# Patient Record
Sex: Female | Born: 1975
Health system: Southern US, Community
[De-identification: ages and names within clinical notes are randomized; demographics above are authoritative.]

---

## 2015-01-03 ENCOUNTER — Ambulatory Visit: Payer: Self-pay | Admitting: Pediatrics

## 2015-01-04 ENCOUNTER — Encounter (INDEPENDENT_AMBULATORY_CARE_PROVIDER_SITE_OTHER): Payer: Self-pay

## 2015-01-04 ENCOUNTER — Ambulatory Visit (INDEPENDENT_AMBULATORY_CARE_PROVIDER_SITE_OTHER): Payer: BLUE CROSS/BLUE SHIELD | Admitting: Family

## 2015-01-04 ENCOUNTER — Encounter: Payer: Self-pay | Admitting: Family

## 2015-01-04 VITALS — BP 112/72 | HR 80 | Temp 97.0°F | Ht 65.0 in | Wt 138.0 lb

## 2015-01-04 DIAGNOSIS — Z0000 Encounter for general adult medical examination without abnormal findings: Secondary | ICD-10-CM

## 2015-01-04 NOTE — Patient Instructions (Signed)
Health Maintenance, Female Adopting a healthy lifestyle and getting preventive care can go a long way to promote health and wellness. Talk with your health care provider about what schedule of regular examinations is right for you. This is a good chance for you to check in with your provider about disease prevention and staying healthy. In between checkups, there are plenty of things you can do on your own. Experts have done a lot of research about which lifestyle changes and preventive measures are most likely to keep you healthy. Ask your health care provider for more information. WEIGHT AND DIET  Eat a healthy diet  Be sure to include plenty of vegetables, fruits, low-fat dairy products, and lean protein.  Do not eat a lot of foods high in solid fats, added sugars, or salt.  Get regular exercise. This is one of the most important things you can do for your health.  Most adults should exercise for at least 150 minutes each week. The exercise should increase your heart rate and make you sweat (moderate-intensity exercise).  Most adults should also do strengthening exercises at least twice a week. This is in addition to the moderate-intensity exercise.  Maintain a healthy weight  Body mass index (BMI) is a measurement that can be used to identify possible weight problems. It estimates body fat based on height and weight. Your health care provider can help determine your BMI and help you achieve or maintain a healthy weight.  For females 20 years of age and older:   A BMI below 18.5 is considered underweight.  A BMI of 18.5 to 24.9 is normal.  A BMI of 25 to 29.9 is considered overweight.  A BMI of 30 and above is considered obese.  Watch levels of cholesterol and blood lipids  You should start having your blood tested for lipids and cholesterol at 38 years of age, then have this test every 5 years.  You may need to have your cholesterol levels checked more often if:  Your lipid  or cholesterol levels are high.  You are older than 38 years of age.  You are at high risk for heart disease.  CANCER SCREENING   Lung Cancer  Lung cancer screening is recommended for adults 55-80 years old who are at high risk for lung cancer because of a history of smoking.  A yearly low-dose CT scan of the lungs is recommended for people who:  Currently smoke.  Have quit within the past 15 years.  Have at least a 30-pack-year history of smoking. A pack year is smoking an average of one pack of cigarettes a day for 1 year.  Yearly screening should continue until it has been 15 years since you quit.  Yearly screening should stop if you develop a health problem that would prevent you from having lung cancer treatment.  Breast Cancer  Practice breast self-awareness. This means understanding how your breasts normally appear and feel.  It also means doing regular breast self-exams. Let your health care provider know about any changes, no matter how small.  If you are in your 20s or 30s, you should have a clinical breast exam (CBE) by a health care provider every 1-3 years as part of a regular health exam.  If you are 40 or older, have a CBE every year. Also consider having a breast X-ray (mammogram) every year.  If you have a family history of breast cancer, talk to your health care provider about genetic screening.  If you   are at high risk for breast cancer, talk to your health care provider about having an MRI and a mammogram every year.  Breast cancer gene (BRCA) assessment is recommended for women who have family members with BRCA-related cancers. BRCA-related cancers include:  Breast.  Ovarian.  Tubal.  Peritoneal cancers.  Results of the assessment will determine the need for genetic counseling and BRCA1 and BRCA2 testing. Cervical Cancer Your health care provider may recommend that you be screened regularly for cancer of the pelvic organs (ovaries, uterus, and  vagina). This screening involves a pelvic examination, including checking for microscopic changes to the surface of your cervix (Pap test). You may be encouraged to have this screening done every 3 years, beginning at age 21.  For women ages 30-65, health care providers may recommend pelvic exams and Pap testing every 3 years, or they may recommend the Pap and pelvic exam, combined with testing for human papilloma virus (HPV), every 5 years. Some types of HPV increase your risk of cervical cancer. Testing for HPV may also be done on women of any age with unclear Pap test results.  Other health care providers may not recommend any screening for nonpregnant women who are considered low risk for pelvic cancer and who do not have symptoms. Ask your health care provider if a screening pelvic exam is right for you.  If you have had past treatment for cervical cancer or a condition that could lead to cancer, you need Pap tests and screening for cancer for at least 20 years after your treatment. If Pap tests have been discontinued, your risk factors (such as having a new sexual partner) need to be reassessed to determine if screening should resume. Some women have medical problems that increase the chance of getting cervical cancer. In these cases, your health care provider may recommend more frequent screening and Pap tests. Colorectal Cancer  This type of cancer can be detected and often prevented.  Routine colorectal cancer screening usually begins at 38 years of age and continues through 38 years of age.  Your health care provider may recommend screening at an earlier age if you have risk factors for colon cancer.  Your health care provider may also recommend using home test kits to check for hidden blood in the stool.  A small camera at the end of a tube can be used to examine your colon directly (sigmoidoscopy or colonoscopy). This is done to check for the earliest forms of colorectal  cancer.  Routine screening usually begins at age 50.  Direct examination of the colon should be repeated every 5-10 years through 38 years of age. However, you may need to be screened more often if early forms of precancerous polyps or small growths are found. Skin Cancer  Check your skin from head to toe regularly.  Tell your health care provider about any new moles or changes in moles, especially if there is a change in a mole's shape or color.  Also tell your health care provider if you have a mole that is larger than the size of a pencil eraser.  Always use sunscreen. Apply sunscreen liberally and repeatedly throughout the day.  Protect yourself by wearing long sleeves, pants, a wide-brimmed hat, and sunglasses whenever you are outside. HEART DISEASE, DIABETES, AND HIGH BLOOD PRESSURE   High blood pressure causes heart disease and increases the risk of stroke. High blood pressure is more likely to develop in:  People who have blood pressure in the high end   of the normal range (130-139/85-89 mm Hg).  People who are overweight or obese.  People who are African American.  If you are 38-23 years of age, have your blood pressure checked every 3-5 years. If you are 61 years of age or older, have your blood pressure checked every year. You should have your blood pressure measured twice--once when you are at a hospital or clinic, and once when you are not at a hospital or clinic. Record the average of the two measurements. To check your blood pressure when you are not at a hospital or clinic, you can use:  An automated blood pressure machine at a pharmacy.  A home blood pressure monitor.  If you are between 45 years and 39 years old, ask your health care provider if you should take aspirin to prevent strokes.  Have regular diabetes screenings. This involves taking a blood sample to check your fasting blood sugar level.  If you are at a normal weight and have a low risk for diabetes,  have this test once every three years after 38 years of age.  If you are overweight and have a high risk for diabetes, consider being tested at a younger age or more often. PREVENTING INFECTION  Hepatitis B  If you have a higher risk for hepatitis B, you should be screened for this virus. You are considered at high risk for hepatitis B if:  You were born in a country where hepatitis B is common. Ask your health care provider which countries are considered high risk.  Your parents were born in a high-risk country, and you have not been immunized against hepatitis B (hepatitis B vaccine).  You have HIV or AIDS.  You use needles to inject street drugs.  You live with someone who has hepatitis B.  You have had sex with someone who has hepatitis B.  You get hemodialysis treatment.  You take certain medicines for conditions, including cancer, organ transplantation, and autoimmune conditions. Hepatitis C  Blood testing is recommended for:  Everyone born from [birth year] through 1964.  Anyone with known risk factors for hepatitis C. Sexually transmitted infections (STIs)  You should be screened for sexually transmitted infections (STIs) including gonorrhea and chlamydia if:  You are sexually active and are younger than 38 years of age.  You are older than 38 years of age and your health care provider tells you that you are at risk for this type of infection.  Your sexual activity has changed since you were last screened and you are at an increased risk for chlamydia or gonorrhea. Ask your health care provider if you are at risk.  If you do not have HIV, but are at risk, it may be recommended that you take a prescription medicine daily to prevent HIV infection. This is called pre-exposure prophylaxis (PrEP). You are considered at risk if:  You are sexually active and do not regularly use condoms or know the HIV status of your partner(s).  You take drugs by injection.  You are sexually  active with a partner who has HIV. Talk with your health care provider about whether you are at high risk of being infected with HIV. If you choose to begin PrEP, you should first be tested for HIV. You should then be tested every 3 months for as long as you are taking PrEP.  PREGNANCY   If you are premenopausal and you may become pregnant, ask your health care provider about preconception counseling.  If you may  become pregnant, take 400 to 800 micrograms (mcg) of folic acid every day.  If you want to prevent pregnancy, talk to your health care provider about birth control (contraception). OSTEOPOROSIS AND MENOPAUSE   Osteoporosis is a disease in which the bones lose minerals and strength with aging. This can result in serious bone fractures. Your risk for osteoporosis can be identified using a bone density scan.  If you are 61 years of age or older, or if you are at risk for osteoporosis and fractures, ask your health care provider if you should be screened.  Ask your health care provider whether you should take a calcium or vitamin D supplement to lower your risk for osteoporosis.  Menopause may have certain physical symptoms and risks.  Hormone replacement therapy may reduce some of these symptoms and risks. Talk to your health care provider about whether hormone replacement therapy is right for you.  HOME CARE INSTRUCTIONS   Schedule regular health, dental, and eye exams.  Stay current with your immunizations.   Do not use any tobacco products including cigarettes, chewing tobacco, or electronic cigarettes.  If you are pregnant, do not drink alcohol.  If you are breastfeeding, limit how much and how often you drink alcohol.  Limit alcohol intake to no more than 1 drink per day for nonpregnant women. One drink equals 12 ounces of beer, 5 ounces of wine, or 1 ounces of hard liquor.  Do not use street drugs.  Do not share needles.  Ask your health care provider for help if  you need support or information about quitting drugs.  Tell your health care provider if you often feel depressed.  Tell your health care provider if you have ever been abused or do not feel safe at home.   This information is not intended to replace advice given to you by your health care provider. Make sure you discuss any questions you have with your health care provider.   Document Released: 07/07/2010 Document Revised: 01/12/2014 Document Reviewed: 11/23/2012 Elsevier Interactive Patient Education [employer].

## 2015-01-04 NOTE — Progress Notes (Signed)
   Subjective:    Patient ID: Rebecca Dennis, female    DOB: 05/01/1975, 39 y.o.   MRN: [id number]  HPI PT presents to the office today with CPE with no pap and to establish. PT currently not taking any medications at this time. Pt denies any headache, palpitations, SOB, or edema at this time.     Review of Systems  Constitutional: Negative.   HENT: Negative.   Eyes: Negative.   Respiratory: Negative.  Negative for shortness of breath.   Cardiovascular: Negative.  Negative for palpitations.  Gastrointestinal: Negative.   Endocrine: Negative.   Genitourinary: Negative.   Musculoskeletal: Negative.   Neurological: Negative.  Negative for headaches.  Hematological: Negative.   Psychiatric/Behavioral: Negative.   All other systems reviewed and are negative.      Objective:   Physical Exam  Constitutional: She is oriented to person, place, and time. She appears well-developed and well-nourished. No distress.  HENT:  Head: Normocephalic and atraumatic.  Right Ear: External ear normal.  Left Ear: External ear normal.  Nose: Nose normal.  Mouth/Throat: Oropharynx is clear and moist.  Eyes: Pupils are equal, round, and reactive to light.  Neck: Normal range of motion. Neck supple. No thyromegaly present.  Cardiovascular: Normal rate, regular rhythm, normal heart sounds and intact distal pulses.   No murmur heard. Pulmonary/Chest: Effort normal and breath sounds normal. No respiratory distress. She has no wheezes.  Abdominal: Soft. Bowel sounds are normal. She exhibits no distension. There is no tenderness.  Musculoskeletal: Normal range of motion. She exhibits no edema or tenderness.  Neurological: She is alert and oriented to person, place, and time. She has normal reflexes. No cranial nerve deficit.  Skin: Skin is warm and dry.  Psychiatric: She has a normal mood and affect. Her behavior is normal. Judgment and thought content normal.  Vitals reviewed.     BP 112/72 mmHg   Pulse 80  Temp(Src) 97 F (36.1 C) (Oral)  Ht _0  (1.651 m)  Wt 138 lb (62.596 kg)  BMI 22.96 kg/m2     Assessment & Plan:  1. Annual physical exam - Anemia Profile B - CMP14+EGFR - Lipid panel - Thyroid Panel With TSH - VITAMIN D 25 Hydroxy (Vit-D Deficiency, Fractures)   Continue all meds Labs pending Health Maintenance reviewed Diet and exercise encouraged RTO 1 year  Evelina Dun, FNP

## 2015-01-05 ENCOUNTER — Other Ambulatory Visit: Payer: Self-pay | Admitting: Family

## 2015-01-05 DIAGNOSIS — E559 Vitamin D deficiency, unspecified: Secondary | ICD-10-CM

## 2015-01-05 LAB — CMP14+EGFR
ALBUMIN: 4.1 g/dL (ref 3.5–5.5)
ALT: 7 IU/L (ref 0–32)
AST: 12 IU/L (ref 0–40)
Albumin/Globulin Ratio: 1.5 (ref 1.1–2.5)
Alkaline Phosphatase: 50 IU/L (ref 39–117)
BUN / CREAT RATIO: 15 (ref 8–20)
BUN: 14 mg/dL (ref 6–20)
Bilirubin Total: 0.3 mg/dL (ref 0.0–1.2)
CALCIUM: 9.3 mg/dL (ref 8.7–10.2)
CO2: 22 mmol/L (ref 18–29)
CREATININE: 0.92 mg/dL (ref 0.57–1.00)
Chloride: 101 mmol/L (ref 96–106)
GFR, EST AFRICAN AMERICAN: 91 mL/min/{1.73_m2} (ref >59)
GFR, EST NON AFRICAN AMERICAN: 79 mL/min/{1.73_m2} (ref >59)
GLUCOSE: 98 mg/dL (ref 65–99)
Globulin, Total: 2.7 g/dL (ref 1.5–4.5)
Potassium: 4.6 mmol/L (ref 3.5–5.2)
Sodium: 139 mmol/L (ref 134–144)
TOTAL PROTEIN: 6.8 g/dL (ref 6.0–8.5)

## 2015-01-05 LAB — ANEMIA PROFILE B
BASOS ABS: 0 10*3/uL (ref 0.0–0.2)
Basos: 1 %
EOS (ABSOLUTE): 0 10*3/uL (ref 0.0–0.4)
Eos: 1 %
Ferritin: 43 ng/mL (ref 15–150)
Folate: 7.1 ng/mL (ref >3.0)
Hematocrit: 37 % (ref 34.0–46.6)
Hemoglobin: 12.5 g/dL (ref 11.1–15.9)
IMMATURE GRANULOCYTES: 0 %
IRON: 58 ug/dL (ref 27–159)
Immature Grans (Abs): 0 10*3/uL (ref 0.0–0.1)
Iron Saturation: 19 % (ref 15–55)
Lymphocytes Absolute: 1.2 10*3/uL (ref 0.7–3.1)
Lymphs: 30 %
MCH: 30.9 pg (ref 26.6–33.0)
MCHC: 33.8 g/dL (ref 31.5–35.7)
MCV: 91 fL (ref 79–97)
MONOCYTES: 9 %
Monocytes Absolute: 0.4 10*3/uL (ref 0.1–0.9)
NEUTROS ABS: 2.3 10*3/uL (ref 1.4–7.0)
Neutrophils: 59 %
PLATELETS: 339 10*3/uL (ref 150–379)
RBC: 4.05 x10E6/uL (ref 3.77–5.28)
RDW: 12.9 % (ref 12.3–15.4)
RETIC CT PCT: 0.8 % (ref 0.6–2.6)
TIBC: 311 ug/dL (ref 250–450)
UIBC: 253 ug/dL (ref 131–425)
VITAMIN B 12: 334 pg/mL (ref 211–946)
WBC: 3.9 10*3/uL (ref 3.4–10.8)

## 2015-01-05 LAB — THYROID PANEL WITH TSH
FREE THYROXINE INDEX: 2.4 (ref 1.2–4.9)
T3 Uptake Ratio: 30 % (ref 24–39)
T4, Total: 8 ug/dL (ref 4.5–12.0)
TSH: 2.29 u[IU]/mL (ref 0.450–4.500)

## 2015-01-05 LAB — LIPID PANEL
Chol/HDL Ratio: 1.9 ratio units (ref 0.0–4.4)
Cholesterol, Total: 148 mg/dL (ref 100–199)
HDL: 78 mg/dL (ref >39)
LDL CALC: 64 mg/dL (ref 0–99)
Triglycerides: 32 mg/dL (ref 0–149)
VLDL CHOLESTEROL CAL: 6 mg/dL (ref 5–40)

## 2015-01-05 LAB — VITAMIN D 25 HYDROXY (VIT D DEFICIENCY, FRACTURES): VIT D 25 HYDROXY: 29.7 ng/mL — AB (ref 30.0–100.0)

## 2015-01-05 MED ORDER — VITAMIN D (ERGOCALCIFEROL) 1.25 MG (50000 UNIT) PO CAPS: 50000.0000 [IU] | ORAL_CAPSULE | ORAL | Status: DC

## 2016-03-09 ENCOUNTER — Ambulatory Visit (INDEPENDENT_AMBULATORY_CARE_PROVIDER_SITE_OTHER): Payer: BLUE CROSS/BLUE SHIELD | Admitting: Family

## 2016-03-09 ENCOUNTER — Encounter: Payer: Self-pay | Admitting: Family

## 2016-03-09 VITALS — BP 101/75 | HR 64 | Temp 98.1°F | Ht 65.0 in | Wt 136.8 lb

## 2016-03-09 DIAGNOSIS — Z0000 Encounter for general adult medical examination without abnormal findings: Secondary | ICD-10-CM

## 2016-03-09 DIAGNOSIS — Z01419 Encounter for gynecological examination (general) (routine) without abnormal findings: Secondary | ICD-10-CM | POA: Diagnosis not present

## 2016-03-09 LAB — MICROSCOPIC EXAMINATION
BACTERIA UA: NONE SEEN
RBC, UA: NONE SEEN /hpf (ref 0–?)
RENAL EPITHEL UA: NONE SEEN /HPF

## 2016-03-09 LAB — URINALYSIS, COMPLETE
BILIRUBIN UA: NEGATIVE
GLUCOSE, UA: NEGATIVE
KETONES UA: NEGATIVE
LEUKOCYTES UA: NEGATIVE
Nitrite, UA: NEGATIVE
PROTEIN UA: NEGATIVE
RBC UA: NEGATIVE
SPEC GRAV UA: 1.01 (ref 1.005–1.030)
Urobilinogen, Ur: 0.2 mg/dL (ref 0.2–1.0)
pH, UA: 7 (ref 5.0–7.5)

## 2016-03-09 NOTE — Patient Instructions (Signed)
Health Maintenance, Female Adopting a healthy lifestyle and getting preventive care can go a long way to promote health and wellness. Talk with your health care provider about what schedule of regular examinations is right for you. This is a good chance for you to check in with your provider about disease prevention and staying healthy. In between checkups, there are plenty of things you can do on your own. Experts have done a lot of research about which lifestyle changes and preventive measures are most likely to keep you healthy. Ask your health care provider for more information. Weight and diet Eat a healthy diet  Be sure to include plenty of vegetables, fruits, low-fat dairy products, and lean protein.  Do not eat a lot of foods high in solid fats, added sugars, or salt.  Get regular exercise. This is one of the most important things you can do for your health.  Most adults should exercise for at least 150 minutes each week. The exercise should increase your heart rate and make you sweat (moderate-intensity exercise).  Most adults should also do strengthening exercises at least twice a week. This is in addition to the moderate-intensity exercise. Maintain a healthy weight  Body mass index (BMI) is a measurement that can be used to identify possible weight problems. It estimates body fat based on height and weight. Your health care provider can help determine your BMI and help you achieve or maintain a healthy weight.  For females 76 years of age and older:  A BMI below 18.5 is considered underweight.  A BMI of 18.5 to 24.9 is normal.  A BMI of 25 to 29.9 is considered overweight.  A BMI of 30 and above is considered obese. Watch levels of cholesterol and blood lipids  You should start having your blood tested for lipids and cholesterol at 40 years of age, then have this test every 5 years.  You may need to have your cholesterol levels checked more often if:  Your lipid or  cholesterol levels are high.  You are older than 40 years of age.  You are at high risk for heart disease. Cancer screening Lung Cancer  Lung cancer screening is recommended for adults 64-42 years old who are at high risk for lung cancer because of a history of smoking.  A yearly low-dose CT scan of the lungs is recommended for people who:  Currently smoke.  Have quit within the past 15 years.  Have at least a 30-pack-year history of smoking. A pack year is smoking an average of one pack of cigarettes a day for 1 year.  Yearly screening should continue until it has been 15 years since you quit.  Yearly screening should stop if you develop a health problem that would prevent you from having lung cancer treatment. Breast Cancer  Practice breast self-awareness. This means understanding how your breasts normally appear and feel.  It also means doing regular breast self-exams. Let your health care provider know about any changes, no matter how small.  If you are in your 20s or 30s, you should have a clinical breast exam (CBE) by a health care provider every 1-3 years as part of a regular health exam.  If you are 34 or older, have a CBE every year. Also consider having a breast X-ray (mammogram) every year.  If you have a family history of breast cancer, talk to your health care provider about genetic screening.  If you are at high risk for breast cancer, talk  to your health care provider about having an MRI and a mammogram every year.  Breast cancer gene (BRCA) assessment is recommended for women who have family members with BRCA-related cancers. BRCA-related cancers include:  Breast.  Ovarian.  Tubal.  Peritoneal cancers.  Results of the assessment will determine the need for genetic counseling and BRCA1 and BRCA2 testing. Cervical Cancer  Your health care provider may recommend that you be screened regularly for cancer of the pelvic organs (ovaries, uterus, and vagina).  This screening involves a pelvic examination, including checking for microscopic changes to the surface of your cervix (Pap test). You may be encouraged to have this screening done every 3 years, beginning at age 24.  For women ages 66-65, health care providers may recommend pelvic exams and Pap testing every 3 years, or they may recommend the Pap and pelvic exam, combined with testing for human papilloma virus (HPV), every 5 years. Some types of HPV increase your risk of cervical cancer. Testing for HPV may also be done on women of any age with unclear Pap test results.  Other health care providers may not recommend any screening for nonpregnant women who are considered low risk for pelvic cancer and who do not have symptoms. Ask your health care provider if a screening pelvic exam is right for you.  If you have had past treatment for cervical cancer or a condition that could lead to cancer, you need Pap tests and screening for cancer for at least 20 years after your treatment. If Pap tests have been discontinued, your risk factors (such as having a new sexual partner) need to be reassessed to determine if screening should resume. Some women have medical problems that increase the chance of getting cervical cancer. In these cases, your health care provider may recommend more frequent screening and Pap tests. Colorectal Cancer  This type of cancer can be detected and often prevented.  Routine colorectal cancer screening usually begins at 40 years of age and continues through 40 years of age.  Your health care provider may recommend screening at an earlier age if you have risk factors for colon cancer.  Your health care provider may also recommend using home test kits to check for hidden blood in the stool.  A small camera at the end of a tube can be used to examine your colon directly (sigmoidoscopy or colonoscopy). This is done to check for the earliest forms of colorectal cancer.  Routine  screening usually begins at age 41.  Direct examination of the colon should be repeated every 5-10 years through 40 years of age. However, you may need to be screened more often if early forms of precancerous polyps or small growths are found. Skin Cancer  Check your skin from head to toe regularly.  Tell your health care provider about any new moles or changes in moles, especially if there is a change in a mole's shape or color.  Also tell your health care provider if you have a mole that is larger than the size of a pencil eraser.  Always use sunscreen. Apply sunscreen liberally and repeatedly throughout the day.  Protect yourself by wearing long sleeves, pants, a wide-brimmed hat, and sunglasses whenever you are outside. Heart disease, diabetes, and high blood pressure  High blood pressure causes heart disease and increases the risk of stroke. High blood pressure is more likely to develop in:  People who have blood pressure in the high end of the normal range (130-139/85-89 mm Hg).  People who are overweight or obese.  People who are African American.  If you are 59-24 years of age, have your blood pressure checked every 3-5 years. If you are 34 years of age or older, have your blood pressure checked every year. You should have your blood pressure measured twice-once when you are at a hospital or clinic, and once when you are not at a hospital or clinic. Record the average of the two measurements. To check your blood pressure when you are not at a hospital or clinic, you can use:  An automated blood pressure machine at a pharmacy.  A home blood pressure monitor.  If you are between 29 years and 60 years old, ask your health care provider if you should take aspirin to prevent strokes.  Have regular diabetes screenings. This involves taking a blood sample to check your fasting blood sugar level.  If you are at a normal weight and have a low risk for diabetes, have this test once  every three years after 40 years of age.  If you are overweight and have a high risk for diabetes, consider being tested at a younger age or more often. Preventing infection Hepatitis B  If you have a higher risk for hepatitis B, you should be screened for this virus. You are considered at high risk for hepatitis B if:  You were born in a country where hepatitis B is common. Ask your health care provider which countries are considered high risk.  Your parents were born in a high-risk country, and you have not been immunized against hepatitis B (hepatitis B vaccine).  You have HIV or AIDS.  You use needles to inject street drugs.  You live with someone who has hepatitis B.  You have had sex with someone who has hepatitis B.  You get hemodialysis treatment.  You take certain medicines for conditions, including cancer, organ transplantation, and autoimmune conditions. Hepatitis C  Blood testing is recommended for:  Everyone born from [birth year] through 1964.  Anyone with known risk factors for hepatitis C. Sexually transmitted infections (STIs)  You should be screened for sexually transmitted infections (STIs) including gonorrhea and chlamydia if:  You are sexually active and are younger than 40 years of age.  You are older than 40 years of age and your health care provider tells you that you are at risk for this type of infection.  Your sexual activity has changed since you were last screened and you are at an increased risk for chlamydia or gonorrhea. Ask your health care provider if you are at risk.  If you do not have HIV, but are at risk, it may be recommended that you take a prescription medicine daily to prevent HIV infection. This is called pre-exposure prophylaxis (PrEP). You are considered at risk if:  You are sexually active and do not regularly use condoms or know the HIV status of your partner(s).  You take drugs by injection.  You are sexually active with a partner  who has HIV. Talk with your health care provider about whether you are at high risk of being infected with HIV. If you choose to begin PrEP, you should first be tested for HIV. You should then be tested every 3 months for as long as you are taking PrEP. Pregnancy  If you are premenopausal and you may become pregnant, ask your health care provider about preconception counseling.  If you may become pregnant, take 400 to 800 micrograms (mcg) of folic acid  every day.  If you want to prevent pregnancy, talk to your health care provider about birth control (contraception). Osteoporosis and menopause  Osteoporosis is a disease in which the bones lose minerals and strength with aging. This can result in serious bone fractures. Your risk for osteoporosis can be identified using a bone density scan.  If you are 4 years of age or older, or if you are at risk for osteoporosis and fractures, ask your health care provider if you should be screened.  Ask your health care provider whether you should take a calcium or vitamin D supplement to lower your risk for osteoporosis.  Menopause may have certain physical symptoms and risks.  Hormone replacement therapy may reduce some of these symptoms and risks. Talk to your health care provider about whether hormone replacement therapy is right for you. Follow these instructions at home:  Schedule regular health, dental, and eye exams.  Stay current with your immunizations.  Do not use any tobacco products including cigarettes, chewing tobacco, or electronic cigarettes.  If you are pregnant, do not drink alcohol.  If you are breastfeeding, limit how much and how often you drink alcohol.  Limit alcohol intake to no more than 1 drink per day for nonpregnant women. One drink equals 12 ounces of beer, 5 ounces of wine, or 1 ounces of hard liquor.  Do not use street drugs.  Do not share needles.  Ask your health care provider for help if you need support  or information about quitting drugs.  Tell your health care provider if you often feel depressed.  Tell your health care provider if you have ever been abused or do not feel safe at home. This information is not intended to replace advice given to you by your health care provider. Make sure you discuss any questions you have with your health care provider. Document Released: 07/07/2010 Document Revised: 05/30/2015 Document Reviewed: 09/25/2014 Elsevier Interactive Patient Education  2017 [employer].

## 2016-03-09 NOTE — Progress Notes (Signed)
Subjective:    Patient ID: Rebecca Dennis, female    DOB: [birth year], 41 y.o.   MRN: [id number]  PT presents to the office today with CPE with no pap and to establish. PT currently not taking any medications at this time. Pt denies any headache, palpitations, SOB, or edema at this time.  Gynecologic Exam  The patient's pertinent negatives include no genital itching, genital odor, missed menses or vaginal discharge. Pertinent negatives include no headaches. Her menstrual history has been regular.       Review of Systems  Constitutional: Negative.   HENT: Negative.   Eyes: Negative.   Respiratory: Negative.  Negative for shortness of breath.   Cardiovascular: Negative.  Negative for palpitations.  Gastrointestinal: Negative.   Endocrine: Negative.   Genitourinary: Negative for missed menses and vaginal discharge.  Musculoskeletal: Negative.   Neurological: Negative.  Negative for headaches.  Hematological: Negative.   Psychiatric/Behavioral: Negative.   All other systems reviewed and are negative.  History reviewed. No pertinent family history.  Social History   Social History  . Marital status: Married    Spouse name: N/A  . Number of children: N/A  . Years of education: N/A   Social History Main Topics  . Smoking status: Never Smoker  . Smokeless tobacco: Never Used  . Alcohol use No  . Drug use: No  . Sexual activity: Yes    Birth control/ protection: Coitus interruptus   Other Topics Concern  . None   Social History Narrative  . None       Objective:   Physical Exam  Constitutional: She is oriented to person, place, and time. She appears well-developed and well-nourished. No distress.  HENT:  Head: Normocephalic and atraumatic.  Right Ear: External ear normal.  Left Ear: External ear normal.  Nose: Nose normal.  Mouth/Throat: Oropharynx is clear and moist.  Eyes: Pupils are equal, round, and reactive to light.  Neck: Normal range of motion. Neck supple.  No thyromegaly present.  Cardiovascular: Normal rate, regular rhythm, normal heart sounds and intact distal pulses.   No murmur heard. Pulmonary/Chest: Effort normal and breath sounds normal. No respiratory distress. She has no wheezes. Right breast exhibits no inverted nipple, no mass, no nipple discharge, no skin change and no tenderness. Left breast exhibits no inverted nipple, no mass, no nipple discharge, no skin change and no tenderness. Breasts are symmetrical.  Abdominal: Soft. Bowel sounds are normal. She exhibits no distension. There is no tenderness.  Genitourinary: Vagina normal.  Genitourinary Comments: Bimanual exam- no adnexal masses or tenderness, ovaries nonpalpable   Cervix parous and pink- No discharge   Musculoskeletal: Normal range of motion. She exhibits no edema or tenderness.  Neurological: She is alert and oriented to person, place, and time. She has normal reflexes. No cranial nerve deficit.  Skin: Skin is warm and dry.  Psychiatric: She has a normal mood and affect. Her behavior is normal. Judgment and thought content normal.  Vitals reviewed.     BP 101/75   Pulse 64   Temp 98.1 F (36.7 C) (Oral)   Ht 5' 5"  (1.651 m)   Wt 136 lb 12.8 oz (62.1 kg)   LMP 02/29/2016   BMI 22.76 kg/m      Assessment & Plan:  1. Gynecologic exam normal - Urinalysis, Complete - Pap IG w/ reflex to HPV when ASC-U  2. Annual physical exam - Anemia Profile B - CMP14+EGFR - Lipid panel - Thyroid Panel With TSH - VITAMIN  D 25 Hydroxy (Vit-D Deficiency, Fractures) - Pap IG w/ reflex to HPV when ASC-U   Continue all meds Labs pending Health Maintenance reviewed Diet and exercise encouraged RTO 1 year  Evelina Dun, FNP

## 2016-03-10 LAB — LIPID PANEL
CHOL/HDL RATIO: 1.9 ratio (ref 0.0–4.4)
Cholesterol, Total: 159 mg/dL (ref 100–199)
HDL: 84 mg/dL (ref >39)
LDL CALC: 68 mg/dL (ref 0–99)
Triglycerides: 33 mg/dL (ref 0–149)
VLDL CHOLESTEROL CAL: 7 mg/dL (ref 5–40)

## 2016-03-10 LAB — ANEMIA PROFILE B
BASOS ABS: 0 10*3/uL (ref 0.0–0.2)
Basos: 0 %
EOS (ABSOLUTE): 0 10*3/uL (ref 0.0–0.4)
Eos: 1 %
FERRITIN: 25 ng/mL (ref 15–150)
Folate: 6.8 ng/mL (ref >3.0)
Hematocrit: 37.3 % (ref 34.0–46.6)
Hemoglobin: 12.7 g/dL (ref 11.1–15.9)
IMMATURE GRANS (ABS): 0 10*3/uL (ref 0.0–0.1)
Immature Granulocytes: 0 %
Iron Saturation: 34 % (ref 15–55)
Iron: 107 ug/dL (ref 27–159)
LYMPHS: 26 %
Lymphocytes Absolute: 1.3 10*3/uL (ref 0.7–3.1)
MCH: 31.1 pg (ref 26.6–33.0)
MCHC: 34 g/dL (ref 31.5–35.7)
MCV: 91 fL (ref 79–97)
MONOCYTES: 9 %
MONOS ABS: 0.5 10*3/uL (ref 0.1–0.9)
Neutrophils Absolute: 3.3 10*3/uL (ref 1.4–7.0)
Neutrophils: 64 %
PLATELETS: 293 10*3/uL (ref 150–379)
RBC: 4.08 x10E6/uL (ref 3.77–5.28)
RDW: 12.9 % (ref 12.3–15.4)
RETIC CT PCT: 0.9 % (ref 0.6–2.6)
Total Iron Binding Capacity: 319 ug/dL (ref 250–450)
UIBC: 212 ug/dL (ref 131–425)
VITAMIN B 12: 236 pg/mL (ref 232–1245)
WBC: 5.1 10*3/uL (ref 3.4–10.8)

## 2016-03-10 LAB — CMP14+EGFR
A/G RATIO: 1.6 (ref 1.2–2.2)
ALT: 7 IU/L (ref 0–32)
AST: 14 IU/L (ref 0–40)
Albumin: 4.2 g/dL (ref 3.5–5.5)
Alkaline Phosphatase: 41 IU/L (ref 39–117)
BILIRUBIN TOTAL: 0.4 mg/dL (ref 0.0–1.2)
BUN/Creatinine Ratio: 15 (ref 9–23)
BUN: 13 mg/dL (ref 6–20)
CHLORIDE: 103 mmol/L (ref 96–106)
CO2: 25 mmol/L (ref 18–29)
Calcium: 9.1 mg/dL (ref 8.7–10.2)
Creatinine, Ser: 0.89 mg/dL (ref 0.57–1.00)
GFR calc non Af Amer: 82 mL/min/{1.73_m2} (ref >59)
GFR, EST AFRICAN AMERICAN: 94 mL/min/{1.73_m2} (ref >59)
GLUCOSE: 88 mg/dL (ref 65–99)
Globulin, Total: 2.6 g/dL (ref 1.5–4.5)
POTASSIUM: 4.5 mmol/L (ref 3.5–5.2)
Sodium: 139 mmol/L (ref 134–144)
TOTAL PROTEIN: 6.8 g/dL (ref 6.0–8.5)

## 2016-03-10 LAB — THYROID PANEL WITH TSH
FREE THYROXINE INDEX: 1.7 (ref 1.2–4.9)
T3 UPTAKE RATIO: 26 % (ref 24–39)
T4 TOTAL: 6.7 ug/dL (ref 4.5–12.0)
TSH: 1.89 u[IU]/mL (ref 0.450–4.500)

## 2016-03-10 LAB — VITAMIN D 25 HYDROXY (VIT D DEFICIENCY, FRACTURES): Vit D, 25-Hydroxy: 33.9 ng/mL (ref 30.0–100.0)

## 2016-03-11 LAB — PAP IG W/ RFLX HPV ASCU: PAP SMEAR COMMENT: 0

## 2017-03-15 ENCOUNTER — Encounter: Payer: Self-pay | Admitting: Family

## 2017-03-15 ENCOUNTER — Ambulatory Visit (INDEPENDENT_AMBULATORY_CARE_PROVIDER_SITE_OTHER): Payer: BLUE CROSS/BLUE SHIELD | Admitting: Family

## 2017-03-15 ENCOUNTER — Telehealth: Payer: Self-pay | Admitting: Family

## 2017-03-15 VITALS — BP 115/77 | HR 80 | Temp 97.9°F | Ht 65.0 in | Wt 135.6 lb

## 2017-03-15 DIAGNOSIS — Z0000 Encounter for general adult medical examination without abnormal findings: Secondary | ICD-10-CM

## 2017-03-15 DIAGNOSIS — E559 Vitamin D deficiency, unspecified: Secondary | ICD-10-CM

## 2017-03-15 NOTE — Progress Notes (Signed)
   Subjective:    Patient ID: Rebecca Dennis, female    DOB: Apr 21, 1975, 42 y.o.   MRN: [id number]  HPI PT presents to the office today for CPE without pap. Currently not taking any medications at this time. Pt denies any headache, palpitations, SOB, or edema at this time.    Review of Systems  All other systems reviewed and are negative.      Objective:   Physical Exam  Constitutional: She is oriented to person, place, and time. She appears well-developed and well-nourished. No distress.  HENT:  Head: Normocephalic and atraumatic.  Right Ear: External ear normal.  Left Ear: External ear normal.  Nose: Nose normal.  Mouth/Throat: Oropharynx is clear and moist.  Eyes: Pupils are equal, round, and reactive to light.  Neck: Normal range of motion. Neck supple. No thyromegaly present.  Cardiovascular: Normal rate, regular rhythm, normal heart sounds and intact distal pulses.  No murmur heard. Pulmonary/Chest: Effort normal and breath sounds normal. No respiratory distress. She has no wheezes.  Abdominal: Soft. Bowel sounds are normal. She exhibits no distension. There is no tenderness.  Musculoskeletal: Normal range of motion. She exhibits no edema or tenderness.  Neurological: She is alert and oriented to person, place, and time.  Skin: Skin is warm and dry.  Psychiatric: She has a normal mood and affect. Her behavior is normal. Judgment and thought content normal.  Vitals reviewed.     BP 115/77   Pulse 80   Temp 97.9 F (36.6 C) (Oral)   Ht '5\' 5"'$  (1.651 m)   Wt 135 lb 9.6 oz (61.5 kg)   BMI 22.57 kg/m      Assessment & Plan:  1. Annual physical exam  - Anemia Profile B - CMP14+EGFR - Lipid panel - VITAMIN D 25 Hydroxy (Vit-D Deficiency, Fractures) - TSH  2. Vitamin D deficiency   Continue all meds Labs pending Health Maintenance reviewed Diet and exercise encouraged RTO 1 year  Evelina Dun, FNP

## 2017-03-15 NOTE — Patient Instructions (Signed)

## 2017-03-16 LAB — CMP14+EGFR
A/G RATIO: 1.7 (ref 1.2–2.2)
ALBUMIN: 4.5 g/dL (ref 3.5–5.5)
ALT: 6 IU/L (ref 0–32)
AST: 17 IU/L (ref 0–40)
Alkaline Phosphatase: 56 IU/L (ref 39–117)
BUN / CREAT RATIO: 14 (ref 9–23)
BUN: 13 mg/dL (ref 6–24)
Bilirubin Total: 0.4 mg/dL (ref 0.0–1.2)
CALCIUM: 9.1 mg/dL (ref 8.7–10.2)
CO2: 21 mmol/L (ref 20–29)
Chloride: 103 mmol/L (ref 96–106)
Creatinine, Ser: 0.9 mg/dL (ref 0.57–1.00)
GFR calc non Af Amer: 80 mL/min/{1.73_m2} (ref >59)
GFR, EST AFRICAN AMERICAN: 92 mL/min/{1.73_m2} (ref >59)
Globulin, Total: 2.7 g/dL (ref 1.5–4.5)
Glucose: 91 mg/dL (ref 65–99)
POTASSIUM: 4.5 mmol/L (ref 3.5–5.2)
Sodium: 140 mmol/L (ref 134–144)
TOTAL PROTEIN: 7.2 g/dL (ref 6.0–8.5)

## 2017-03-16 LAB — ANEMIA PROFILE B
BASOS: 0 %
Basophils Absolute: 0 10*3/uL (ref 0.0–0.2)
EOS (ABSOLUTE): 0 10*3/uL (ref 0.0–0.4)
EOS: 0 %
FERRITIN: 49 ng/mL (ref 15–150)
Folate: 16.3 ng/mL (ref >3.0)
HEMATOCRIT: 36.4 % (ref 34.0–46.6)
HEMOGLOBIN: 12.4 g/dL (ref 11.1–15.9)
IMMATURE GRANS (ABS): 0 10*3/uL (ref 0.0–0.1)
IRON: 107 ug/dL (ref 27–159)
Immature Granulocytes: 0 %
Iron Saturation: 35 % (ref 15–55)
LYMPHS: 23 %
Lymphocytes Absolute: 1.4 10*3/uL (ref 0.7–3.1)
MCH: 31.1 pg (ref 26.6–33.0)
MCHC: 34.1 g/dL (ref 31.5–35.7)
MCV: 91 fL (ref 79–97)
Monocytes Absolute: 0.5 10*3/uL (ref 0.1–0.9)
Monocytes: 7 %
NEUTROS ABS: 4.4 10*3/uL (ref 1.4–7.0)
Neutrophils: 70 %
Platelets: 302 10*3/uL (ref 150–379)
RBC: 3.99 x10E6/uL (ref 3.77–5.28)
RDW: 12.9 % (ref 12.3–15.4)
Retic Ct Pct: 0.6 % (ref 0.6–2.6)
Total Iron Binding Capacity: 306 ug/dL (ref 250–450)
UIBC: 199 ug/dL (ref 131–425)
Vitamin B-12: 318 pg/mL (ref 232–1245)
WBC: 6.3 10*3/uL (ref 3.4–10.8)

## 2017-03-16 LAB — LIPID PANEL
CHOL/HDL RATIO: 1.9 ratio (ref 0.0–4.4)
Cholesterol, Total: 136 mg/dL (ref 100–199)
HDL: 71 mg/dL (ref >39)
LDL Calculated: 58 mg/dL (ref 0–99)
Triglycerides: 36 mg/dL (ref 0–149)
VLDL Cholesterol Cal: 7 mg/dL (ref 5–40)

## 2017-03-16 LAB — VITAMIN D 25 HYDROXY (VIT D DEFICIENCY, FRACTURES): VIT D 25 HYDROXY: 39 ng/mL (ref 30.0–100.0)

## 2017-03-16 LAB — TSH: TSH: 1.25 u[IU]/mL (ref 0.450–4.500)

## 2017-03-19 ENCOUNTER — Telehealth: Payer: Self-pay

## 2017-03-19 DIAGNOSIS — Z1239 Encounter for other screening for malignant neoplasm of breast: Secondary | ICD-10-CM

## 2017-03-19 NOTE — Telephone Encounter (Signed)
[name]

## 2017-03-22 NOTE — Addendum Note (Signed)
Addended by: Jannifer RodneyHAWKS, Kaytlyn Din A on: 03/22/2017 08:31 AM   Modules accepted: Orders

## 2017-03-24 NOTE — Telephone Encounter (Signed)
[facility]  Letter sent with appointment date/time

## 2017-04-02 ENCOUNTER — Ambulatory Visit (HOSPITAL_COMMUNITY): Payer: Self-pay

## 2017-04-05 ENCOUNTER — Encounter (HOSPITAL_COMMUNITY): Payer: Self-pay

## 2017-04-05 ENCOUNTER — Other Ambulatory Visit: Payer: Self-pay | Admitting: Family

## 2017-04-05 ENCOUNTER — Ambulatory Visit (HOSPITAL_COMMUNITY)
Admission: RE | Admit: 2017-04-05 | Discharge: 2017-04-05 | Disposition: A | Discharge status: Home / Self Care | Payer: BLUE CROSS/BLUE SHIELD | Source: Ambulatory Visit | Attending: Family | Admitting: Family

## 2017-04-05 DIAGNOSIS — Z1231 Encounter for screening mammogram for malignant neoplasm of breast: Secondary | ICD-10-CM | POA: Insufficient documentation

## 2017-04-05 DIAGNOSIS — Z1239 Encounter for other screening for malignant neoplasm of breast: Secondary | ICD-10-CM

## 2017-05-10 ENCOUNTER — Telehealth: Payer: Self-pay | Admitting: Family

## 2017-05-11 NOTE — Telephone Encounter (Signed)
Spoke with pt regarding tick bite Pt will monitor bite for redness, drainage or rash Will call for appt if sxs

## 2017-12-09 ENCOUNTER — Encounter: Payer: Self-pay | Admitting: Family Medicine

## 2017-12-09 ENCOUNTER — Ambulatory Visit (INDEPENDENT_AMBULATORY_CARE_PROVIDER_SITE_OTHER): Payer: Managed Care, Other (non HMO)

## 2017-12-09 ENCOUNTER — Ambulatory Visit: Payer: Managed Care, Other (non HMO) | Admitting: Family Medicine

## 2017-12-09 ENCOUNTER — Other Ambulatory Visit: Payer: Self-pay | Admitting: Family Medicine

## 2017-12-09 VITALS — BP 117/82 | HR 66 | Temp 96.9°F | Ht 65.0 in | Wt 137.0 lb

## 2017-12-09 DIAGNOSIS — M25572 Pain in left ankle and joints of left foot: Secondary | ICD-10-CM | POA: Diagnosis not present

## 2017-12-09 DIAGNOSIS — S93492A Sprain of other ligament of left ankle, initial encounter: Secondary | ICD-10-CM

## 2017-12-09 DIAGNOSIS — R52 Pain, unspecified: Secondary | ICD-10-CM

## 2017-12-09 NOTE — Progress Notes (Signed)
BP 117/82   Pulse 66   Temp (!) 96.9 F (36.1 C) (Oral)   Ht 5\' 5"  (1.651 m)   Wt 137 lb (62.1 kg)   BMI 22.80 kg/m    Subjective:    Patient ID: Rebecca Dennis, female    DOB: 04-16-1975, 42 y.o.   MRN: [id number]  HPI: Rebecca Dennis is a 42 y.o. female presenting on 12/09/2017 for Ankle Pain (Stepped in a hole)   HPI Patient comes in because she stepped in a hole earlier today and is having some pain in her ankle and wanted to make sure that nothing was fractured.  She says that she was in her yard and walking and she knew the hole was there but she still actually stepped in it and rolled her ankle to the outside on that left ankle.  She has pain on the outside of her ankle and has a little bit of swelling there as well.  She is having some instability in that ankle but denies any giving way and says the pain is mild to moderate at this point but she just one make sure it was not fractured  Relevant past medical, surgical, family and social history reviewed and updated as indicated. Interim medical history since our last visit reviewed. Allergies and medications reviewed and updated.  Review of Systems  Constitutional: Negative for chills and fever.  Eyes: Negative for visual disturbance.  Respiratory: Negative for chest tightness and shortness of breath.   Cardiovascular: Negative for chest pain and leg swelling.  Musculoskeletal: Positive for arthralgias and joint swelling. Negative for back pain and gait problem.  Skin: Negative for rash.  Neurological: Negative for light-headedness and headaches.  Psychiatric/Behavioral: Negative for agitation and behavioral problems.  All other systems reviewed and are negative.   Per HPI unless specifically indicated above   Allergies as of 12/09/2017   No Known Allergies     Medication List    as of 12/09/2017  4:58 PM   You have not been prescribed any medications.        Objective:    BP 117/82   Pulse 66   Temp (!) 96.9 F  (36.1 C) (Oral)   Ht 5\' 5"  (1.651 m)   Wt 137 lb (62.1 kg)   BMI 22.80 kg/m   Wt Readings from Last 3 Encounters:  12/09/17 137 lb (62.1 kg)  03/15/17 135 lb 9.6 oz (61.5 kg)  03/09/16 136 lb 12.8 oz (62.1 kg)    Physical Exam  Constitutional: She is oriented to person, place, and time. She appears well-developed and well-nourished. No distress.  Eyes: Conjunctivae are normal.  Musculoskeletal: Normal range of motion. She exhibits no edema.       Left ankle: She exhibits swelling. She exhibits normal range of motion, no ecchymosis and no deformity. Tenderness. AITFL tenderness found.       Feet:  Neurological: She is alert and oriented to person, place, and time.  Skin: Skin is warm and dry. No rash noted. She is not diaphoretic. No erythema.  Nursing note and vitals reviewed.   Left ankle x-ray: No signs of acute bony abnormality fracture or dislocation    Assessment & Plan:   Problem List Items Addressed This Visit    None    Visit Diagnoses    Sprain of anterior talofibular ligament of left ankle, initial encounter    -  Primary    Gave list of exercises and stretches and recommended icing and elevation  and rehabbing.  Follow up plan: Return if symptoms worsen or fail to improve.  Counseling provided for all of the vaccine components No orders of the defined types were placed in this encounter.   Arville Care, MD [facility] 12/09/2017, 4:58 PM

## 2018-03-21 ENCOUNTER — Encounter: Payer: Self-pay | Admitting: Family

## 2018-03-21 ENCOUNTER — Other Ambulatory Visit: Payer: Self-pay

## 2018-03-21 ENCOUNTER — Ambulatory Visit (INDEPENDENT_AMBULATORY_CARE_PROVIDER_SITE_OTHER): Payer: Managed Care, Other (non HMO) | Admitting: Family

## 2018-03-21 VITALS — BP 99/65 | HR 68 | Temp 97.8°F | Ht 65.0 in | Wt 137.2 lb

## 2018-03-21 DIAGNOSIS — Z0000 Encounter for general adult medical examination without abnormal findings: Secondary | ICD-10-CM | POA: Diagnosis not present

## 2018-03-21 DIAGNOSIS — Z01419 Encounter for gynecological examination (general) (routine) without abnormal findings: Secondary | ICD-10-CM

## 2018-03-21 LAB — URINALYSIS, COMPLETE
BILIRUBIN UA: NEGATIVE
GLUCOSE, UA: NEGATIVE
Ketones, UA: NEGATIVE
LEUKOCYTES UA: NEGATIVE
Nitrite, UA: NEGATIVE
PH UA: 6.5 (ref 5.0–7.5)
PROTEIN UA: NEGATIVE
RBC UA: NEGATIVE
Specific Gravity, UA: 1.015 (ref 1.005–1.030)
UUROB: 0.2 mg/dL (ref 0.2–1.0)

## 2018-03-21 LAB — MICROSCOPIC EXAMINATION
BACTERIA UA: NONE SEEN
RBC, UA: NONE SEEN /hpf (ref 0–2)
RENAL EPITHEL UA: NONE SEEN /HPF
WBC UA: NONE SEEN /HPF (ref 0–5)

## 2018-03-21 NOTE — Progress Notes (Signed)
Subjective:    Patient ID: Rebecca Dennis, female    DOB: May 27, 1975, 43 y.o.   MRN: [id number]  Chief Complaint  Patient presents with  . Gynecologic Exam    PT presents to the office today for CPE with pap. Currently not taking any medications at this time. Pt denies any headache, palpitations, SOB, or edema at this time.  Gynecologic Exam  The patient's pertinent negatives include no genital itching, genital lesions or vaginal discharge. The patient is experiencing no pain. Pertinent negatives include no painful intercourse.      Review of Systems  Genitourinary: Negative for vaginal discharge.  All other systems reviewed and are negative.      Objective:   Physical Exam Vitals signs reviewed.  Constitutional:      General: She is not in acute distress.    Appearance: She is well-developed.  HENT:     Head: Normocephalic and atraumatic.     Right Ear: Tympanic membrane normal.     Left Ear: Tympanic membrane normal.  Eyes:     Pupils: Pupils are equal, round, and reactive to light.  Neck:     Musculoskeletal: Normal range of motion and neck supple.     Thyroid: No thyromegaly.  Cardiovascular:     Rate and Rhythm: Normal rate and regular rhythm.     Heart sounds: Normal heart sounds. No murmur.  Pulmonary:     Effort: Pulmonary effort is normal. No respiratory distress.     Breath sounds: Normal breath sounds. No wheezing.  Chest:     Breasts:        Right: No swelling, bleeding, inverted nipple, mass, nipple discharge, skin change or tenderness.        Left: No swelling, bleeding, inverted nipple, mass, nipple discharge, skin change or tenderness.  Abdominal:     General: Bowel sounds are normal. There is no distension.     Palpations: Abdomen is soft.     Tenderness: There is no abdominal tenderness.  Genitourinary:    General: Normal vulva.     Comments: Bimanual exam- no adnexal masses or tenderness, ovaries nonpalpable   Cervix parous and pink- No  discharge  Musculoskeletal: Normal range of motion.        General: No tenderness.  Skin:    General: Skin is warm and dry.  Neurological:     Mental Status: She is alert and oriented to person, place, and time.     Cranial Nerves: No cranial nerve deficit.     Deep Tendon Reflexes: Reflexes are normal and symmetric.  Psychiatric:        Behavior: Behavior normal.        Thought Content: Thought content normal.        Judgment: Judgment normal.       BP 99/65   Pulse 68   Temp 97.8 F (36.6 C) (Oral)   Ht _0  (1.651 m)   Wt 137 lb 3.2 oz (62.2 kg)   BMI 22.83 kg/m      Assessment & Plan:  Rebecca Dennis comes in today with chief complaint of Gynecologic Exam   Diagnosis and orders addressed:  1. Normal gynecologic examination - Urinalysis, Complete - IGP, Aptima HPV, rfx 16/18,45  2. Annual physical exam - CMP14+EGFR - CBC with Differential/Platelet - Lipid panel - TSH - IGP, Aptima HPV, rfx 16/18,45   Labs pending Health Maintenance reviewed Diet and exercise encouraged  Follow up plan: 1 year   Evelina Dun, FNP

## 2018-03-21 NOTE — Patient Instructions (Signed)

## 2018-03-22 LAB — CBC WITH DIFFERENTIAL/PLATELET
BASOS ABS: 0.1 10*3/uL (ref 0.0–0.2)
Basos: 1 %
EOS (ABSOLUTE): 0 10*3/uL (ref 0.0–0.4)
Eos: 0 %
HEMOGLOBIN: 12.9 g/dL (ref 11.1–15.9)
Hematocrit: 38.4 % (ref 34.0–46.6)
IMMATURE GRANULOCYTES: 0 %
Immature Grans (Abs): 0 10*3/uL (ref 0.0–0.1)
LYMPHS: 20 %
Lymphocytes Absolute: 1.6 10*3/uL (ref 0.7–3.1)
MCH: 30.2 pg (ref 26.6–33.0)
MCHC: 33.6 g/dL (ref 31.5–35.7)
MCV: 90 fL (ref 79–97)
Monocytes Absolute: 0.6 10*3/uL (ref 0.1–0.9)
Monocytes: 8 %
NEUTROS ABS: 5.7 10*3/uL (ref 1.4–7.0)
Neutrophils: 71 %
Platelets: 309 10*3/uL (ref 150–450)
RBC: 4.27 x10E6/uL (ref 3.77–5.28)
RDW: 11.7 % (ref 11.7–15.4)
WBC: 8 10*3/uL (ref 3.4–10.8)

## 2018-03-22 LAB — CMP14+EGFR
ALT: 8 IU/L (ref 0–32)
AST: 22 IU/L (ref 0–40)
Albumin/Globulin Ratio: 2 (ref 1.2–2.2)
Albumin: 4.7 g/dL (ref 3.8–4.8)
Alkaline Phosphatase: 45 IU/L (ref 39–117)
BILIRUBIN TOTAL: 0.3 mg/dL (ref 0.0–1.2)
BUN/Creatinine Ratio: 15 (ref 9–23)
BUN: 12 mg/dL (ref 6–24)
CALCIUM: 9.3 mg/dL (ref 8.7–10.2)
CHLORIDE: 103 mmol/L (ref 96–106)
CO2: 21 mmol/L (ref 20–29)
Creatinine, Ser: 0.82 mg/dL (ref 0.57–1.00)
GFR, EST AFRICAN AMERICAN: 103 mL/min/{1.73_m2} (ref >59)
GFR, EST NON AFRICAN AMERICAN: 89 mL/min/{1.73_m2} (ref >59)
GLUCOSE: 89 mg/dL (ref 65–99)
Globulin, Total: 2.4 g/dL (ref 1.5–4.5)
Potassium: 4.3 mmol/L (ref 3.5–5.2)
Sodium: 139 mmol/L (ref 134–144)
TOTAL PROTEIN: 7.1 g/dL (ref 6.0–8.5)

## 2018-03-22 LAB — LIPID PANEL
CHOLESTEROL TOTAL: 158 mg/dL (ref 100–199)
Chol/HDL Ratio: 1.9 ratio (ref 0.0–4.4)
HDL: 85 mg/dL (ref >39)
LDL CALC: 66 mg/dL (ref 0–99)
TRIGLYCERIDES: 36 mg/dL (ref 0–149)
VLDL CHOLESTEROL CAL: 7 mg/dL (ref 5–40)

## 2018-03-22 LAB — TSH: TSH: 3.15 u[IU]/mL (ref 0.450–4.500)

## 2018-03-24 LAB — IGP, APTIMA HPV, RFX 16/18,45: HPV APTIMA: NEGATIVE

## 2018-05-31 ENCOUNTER — Other Ambulatory Visit (HOSPITAL_COMMUNITY): Payer: Self-pay | Admitting: Family

## 2018-05-31 DIAGNOSIS — Z1231 Encounter for screening mammogram for malignant neoplasm of breast: Secondary | ICD-10-CM

## 2018-06-06 ENCOUNTER — Ambulatory Visit (HOSPITAL_COMMUNITY)
Admission: RE | Admit: 2018-06-06 | Discharge: 2018-06-06 | Disposition: A | Discharge status: Home / Self Care | Payer: Managed Care, Other (non HMO) | Source: Ambulatory Visit | Attending: Family | Admitting: Family

## 2018-06-06 ENCOUNTER — Other Ambulatory Visit: Payer: Self-pay

## 2018-06-06 DIAGNOSIS — Z1231 Encounter for screening mammogram for malignant neoplasm of breast: Secondary | ICD-10-CM | POA: Diagnosis not present

## 2018-08-01 ENCOUNTER — Encounter: Payer: Self-pay | Admitting: Nurse Practitioner

## 2018-08-01 ENCOUNTER — Ambulatory Visit: Payer: Managed Care, Other (non HMO) | Admitting: Nurse Practitioner

## 2018-08-01 ENCOUNTER — Other Ambulatory Visit: Payer: Self-pay

## 2018-08-01 VITALS — BP 93/62 | HR 67 | Temp 98.6°F | Ht 65.0 in | Wt 142.0 lb

## 2018-08-01 DIAGNOSIS — S0006XA Insect bite (nonvenomous) of scalp, initial encounter: Secondary | ICD-10-CM | POA: Diagnosis not present

## 2018-08-01 DIAGNOSIS — W57XXXA Bitten or stung by nonvenomous insect and other nonvenomous arthropods, initial encounter: Secondary | ICD-10-CM

## 2018-08-01 MED ORDER — DOXYCYCLINE HYCLATE 100 MG PO TABS: ORAL_TABLET | ORAL | 0 refills | Status: DC

## 2018-08-01 NOTE — Progress Notes (Signed)
   Subjective:    Patient ID: Rebecca Dennis, female    DOB: 01-02-1976, 43 y.o.   MRN: [id number]   Chief Complaint: Tick Removal   HPI Patient said she removed a tick from her scalp 1 week ago. Area was sore to touch. She said the tick was on there for less than 24 hours. She denies any headaches, fatigue or fever.   Review of Systems  Constitutional: Negative for activity change and appetite change.  HENT: Negative.   Eyes: Negative for pain.  Respiratory: Negative for shortness of breath.   Cardiovascular: Negative for chest pain, palpitations and leg swelling.  Gastrointestinal: Negative for abdominal pain.  Endocrine: Negative for polydipsia.  Genitourinary: Negative.   Skin: Negative for rash.  Neurological: Negative for dizziness, weakness and headaches.  Hematological: Does not bruise/bleed easily.  Psychiatric/Behavioral: Negative.   All other systems reviewed and are negative.      Objective:   Physical Exam Vitals signs and nursing note reviewed.  Constitutional:      Appearance: Normal appearance.  Cardiovascular:     Rate and Rhythm: Normal rate and regular rhythm.     Pulses: Normal pulses.     Heart sounds: Normal heart sounds.  Skin:    General: Skin is warm and dry.     Comments: Small pinpoint nontender erythematous esio left post scalp area.  Neurological:     General: No focal deficit present.     Mental Status: She is alert and oriented to person, place, and time.  Psychiatric:        Mood and Affect: Mood normal.        Behavior: Behavior normal.    BP 93/62   Pulse 67   Temp 98.6 F (37 C) (Oral)   Ht 5\' 5"  (1.651 m)   Wt 142 lb (64.4 kg)   BMI 23.63 kg/m         Assessment & Plan:  Rebecca Dennis in today with chief complaint of Tick Removal   1. Tick bite, initial encounter Given prophylactic doxycycline for any new tick bite.  Check daly for any new ticks. - doxycycline (VIBRA-TABS) 100 MG tablet; 1 po when has tick bite   Dispense: 10 tablet; Refill: 0  Mary-Margaret Hassell Done, FNP

## 2018-08-01 NOTE — Patient Instructions (Signed)

## 2018-09-07 ENCOUNTER — Other Ambulatory Visit: Payer: Self-pay | Admitting: *Deleted

## 2018-09-07 DIAGNOSIS — Z20822 Contact with and (suspected) exposure to covid-19: Secondary | ICD-10-CM

## 2018-09-08 LAB — NOVEL CORONAVIRUS, NAA: SARS-CoV-2, NAA: NOT DETECTED

## 2018-10-07 ENCOUNTER — Other Ambulatory Visit: Payer: Self-pay

## 2018-10-07 ENCOUNTER — Ambulatory Visit: Payer: Managed Care, Other (non HMO) | Admitting: Nurse Practitioner

## 2018-10-07 ENCOUNTER — Encounter: Payer: Self-pay | Admitting: Nurse Practitioner

## 2018-10-07 DIAGNOSIS — N3000 Acute cystitis without hematuria: Secondary | ICD-10-CM | POA: Diagnosis not present

## 2018-10-07 DIAGNOSIS — R300 Dysuria: Secondary | ICD-10-CM

## 2018-10-07 LAB — URINALYSIS, COMPLETE
Bilirubin, UA: NEGATIVE
Glucose, UA: NEGATIVE
Ketones, UA: NEGATIVE
Nitrite, UA: NEGATIVE
Protein,UA: NEGATIVE
Specific Gravity, UA: 1.015 (ref 1.005–1.030)
Urobilinogen, Ur: 0.2 mg/dL (ref 0.2–1.0)
pH, UA: 7 (ref 5.0–7.5)

## 2018-10-07 LAB — MICROSCOPIC EXAMINATION: Renal Epithel, UA: NONE SEEN /hpf

## 2018-10-07 MED ORDER — CEPHALEXIN 500 MG PO CAPS: 500.0000 mg | ORAL_CAPSULE | Freq: Two times a day (BID) | ORAL | 0 refills | Status: DC

## 2018-10-07 NOTE — Progress Notes (Signed)
   Virtual Visit via telephone Note Due to COVID-19 pandemic this visit was conducted virtually. This visit type was conducted due to national recommendations for restrictions regarding the COVID-19 Pandemic (e.g. social distancing, sheltering in place) in an effort to limit this patient's exposure and mitigate transmission in our community. All issues noted in this document were discussed and addressed.  A physical exam was not performed with this format.  I connected with Rebecca Dennis on 10/07/18 at 10:00 by telephone and verified that I am speaking with the correct person using two identifiers. Rebecca Dennis is currently located at work and no one is currently with her during visit. The provider, Mary-Margaret Hassell Done, FNP is located in their office at time of visit.  I discussed the limitations, risks, security and privacy concerns of performing an evaluation and management service by telephone and the availability of in person appointments. I also discussed with the patient that there may be a patient responsible charge related to this service. The patient expressed understanding and agreed to proceed.   History and Present Illness:  Patient calls in c/o dysuria, frequency and urgency. Has used no OTC meds. Denies back pain and suprpubic pain.   Review of Systems  Genitourinary: Positive for dysuria, frequency and urgency.  All other systems reviewed and are negative.    Observations/Objective: Alert and oriented- answers all questions appropriately No distress    Assessment and Plan: [name] in today with chief complaint of Urinary Tract Infection   1. Dysuria  - Urinalysis, Complete  2. Acute cystitis without hematuria Take medication as prescribe Cotton underwear Take shower not bath Cranberry juice, yogurt Force fluids AZO over the counter X2 days Culture pending RTO prn  - cephALEXin (KEFLEX) 500 MG capsule; Take 1 capsule (500 mg total) by mouth 2 (two) times  daily.  Dispense: 14 capsule; Refill: 0 - Urine Culture   Follow Up Instructions: prn    I discussed the assessment and treatment plan with the patient. The patient was provided an opportunity to ask questions and all were answered. The patient agreed with the plan and demonstrated an understanding of the instructions.   The patient was advised to call back or seek an in-person evaluation if the symptoms worsen or if the condition fails to improve as anticipated.  The above assessment and management plan was discussed with the patient. The patient verbalized understanding of and has agreed to the management plan. Patient is aware to call the clinic if symptoms persist or worsen. Patient is aware when to return to the clinic for a follow-up visit. Patient educated on when it is appropriate to go to the emergency department.   Time call ended: 10:10  I provided 10 minutes of non-face-to-face time during this encounter.    Mary-Margaret Hassell Done, FNP

## 2018-10-08 LAB — URINE CULTURE

## 2018-11-30 ENCOUNTER — Ambulatory Visit: Payer: Managed Care, Other (non HMO)

## 2018-12-15 ENCOUNTER — Other Ambulatory Visit: Payer: Self-pay

## 2018-12-15 DIAGNOSIS — Z20822 Contact with and (suspected) exposure to covid-19: Secondary | ICD-10-CM

## 2018-12-16 LAB — NOVEL CORONAVIRUS, NAA: SARS-CoV-2, NAA: NOT DETECTED

## 2018-12-19 ENCOUNTER — Other Ambulatory Visit: Payer: Self-pay

## 2018-12-19 DIAGNOSIS — Z20822 Contact with and (suspected) exposure to covid-19: Secondary | ICD-10-CM

## 2018-12-22 LAB — NOVEL CORONAVIRUS, NAA: SARS-CoV-2, NAA: NOT DETECTED

## 2018-12-23 ENCOUNTER — Other Ambulatory Visit: Payer: Managed Care, Other (non HMO)

## 2019-10-30 ENCOUNTER — Other Ambulatory Visit: Payer: Self-pay

## 2019-10-30 ENCOUNTER — Encounter: Payer: Self-pay | Admitting: Family

## 2019-10-30 ENCOUNTER — Ambulatory Visit (INDEPENDENT_AMBULATORY_CARE_PROVIDER_SITE_OTHER): Payer: 59 | Admitting: Family

## 2019-10-30 VITALS — BP 110/69 | HR 67 | Temp 97.9°F | Ht 65.0 in | Wt 140.8 lb

## 2019-10-30 DIAGNOSIS — R635 Abnormal weight gain: Secondary | ICD-10-CM | POA: Diagnosis not present

## 2019-10-30 DIAGNOSIS — Z0001 Encounter for general adult medical examination with abnormal findings: Secondary | ICD-10-CM | POA: Diagnosis not present

## 2019-10-30 DIAGNOSIS — R5383 Other fatigue: Secondary | ICD-10-CM | POA: Diagnosis not present

## 2019-10-30 DIAGNOSIS — Z23 Encounter for immunization: Secondary | ICD-10-CM | POA: Diagnosis not present

## 2019-10-30 DIAGNOSIS — Z0000 Encounter for general adult medical examination without abnormal findings: Secondary | ICD-10-CM

## 2019-10-30 DIAGNOSIS — Z1159 Encounter for screening for other viral diseases: Secondary | ICD-10-CM | POA: Diagnosis not present

## 2019-10-30 NOTE — Patient Instructions (Addendum)
Fatigue If you have fatigue, you feel tired all the time and have a lack of energy or a lack of motivation. Fatigue may make it difficult to start or complete tasks because of exhaustion. In general, occasional or mild fatigue is often a normal response to activity or life. However, long-lasting (chronic) or extreme fatigue may be a symptom of a medical condition. Follow these instructions at home: General instructions  Watch your fatigue for any changes.  Go to bed and get up at the same time every day.  Avoid fatigue by pacing yourself during the day and getting enough sleep at night.  Maintain a healthy weight. Medicines  Take over-the-counter and prescription medicines only as told by your health care provider.  Take a multivitamin, if told by your health care provider.  Do not use herbal or dietary supplements unless they are approved by your health care provider. Activity   Exercise regularly, as told by your health care provider.  Use or practice techniques to help you relax, such as yoga, tai chi, meditation, or massage therapy. Eating and drinking   Avoid heavy meals in the evening.  Eat a well-balanced diet, which includes lean proteins, whole grains, plenty of fruits and vegetables, and low-fat dairy products.  Avoid consuming too much caffeine.  Avoid the use of alcohol.  Drink enough fluid to keep your urine pale yellow. Lifestyle  Change situations that cause you stress. Try to keep your work and personal schedule in balance.  Do not use any products that contain nicotine or tobacco, such as cigarettes and e-cigarettes. If you need help quitting, ask your health care provider.  Do not use drugs. Contact a health care provider if:  Your fatigue does not get better.  You have a fever.  You suddenly lose or gain weight.  You have headaches.  You have trouble falling asleep or sleeping through the night.  You feel angry, guilty, anxious, or  sad.  You are unable to have a bowel movement (constipation).  Your skin is dry.  You have swelling in your legs or another part of your body. Get help right away if:  You feel confused.  Your vision is blurry.  You feel faint or you pass out.  You have a severe headache.  You have severe pain in your abdomen, your back, or the area between your waist and hips (pelvis).  You have chest pain, shortness of breath, or an irregular or fast heartbeat.  You are unable to urinate, or you urinate less than normal.  You have abnormal bleeding, such as bleeding from the rectum, vagina, nose, lungs, or nipples.  You vomit blood.  You have thoughts about hurting yourself or others. If you ever feel like you may hurt yourself or others, or have thoughts about taking your own life, get help right away. You can go to your nearest emergency department or call:  Your local emergency services (911 in the [place]  A suicide crisis helpline, such as the [facility] at 434-801-2361. This is open 24 hours a day. Summary  If you have fatigue, you feel tired all the time and have a lack of energy or a lack of motivation.  Fatigue may make it difficult to start or complete tasks because of exhaustion.  Long-lasting (chronic) or extreme fatigue may be a symptom of a medical condition.  Exercise regularly, as told by your health care provider.  Change situations that cause you stress. Try to keep your  work and personal schedule in balance. This information is not intended to replace advice given to you by your health care provider. Make sure you discuss any questions you have with your health care provider. Document Revised: 07/13/2018 Document Reviewed: 09/16/2016 Elsevier Patient Education  2020 [employer].   Health Maintenance, Female Adopting a healthy lifestyle and getting preventive care are important in promoting health and wellness. Ask your health care  provider about:  The right schedule for you to have regular tests and exams.  Things you can do on your own to prevent diseases and keep yourself healthy. What should I know about diet, weight, and exercise? Eat a healthy diet   Eat a diet that includes plenty of vegetables, fruits, low-fat dairy products, and lean protein.  Do not eat a lot of foods that are high in solid fats, added sugars, or sodium. Maintain a healthy weight Body mass index (BMI) is used to identify weight problems. It estimates body fat based on height and weight. Your health care provider can help determine your BMI and help you achieve or maintain a healthy weight. Get regular exercise Get regular exercise. This is one of the most important things you can do for your health. Most adults should:  Exercise for at least 150 minutes each week. The exercise should increase your heart rate and make you sweat (moderate-intensity exercise).  Do strengthening exercises at least twice a week. This is in addition to the moderate-intensity exercise.  Spend less time sitting. Even light physical activity can be beneficial. Watch cholesterol and blood lipids Have your blood tested for lipids and cholesterol at 43 years of age, then have this test every 5 years. Have your cholesterol levels checked more often if:  Your lipid or cholesterol levels are high.  You are older than 43 years of age.  You are at high risk for heart disease. What should I know about cancer screening? Depending on your health history and family history, you may need to have cancer screening at various ages. This may include screening for:  Breast cancer.  Cervical cancer.  Colorectal cancer.  Skin cancer.  Lung cancer. What should I know about heart disease, diabetes, and high blood pressure? Blood pressure and heart disease  High blood pressure causes heart disease and increases the risk of stroke. This is more likely to develop in  people who have high blood pressure readings, are of African descent, or are overweight.  Have your blood pressure checked: ? Every 3-5 years if you are 18-87 years of age. ? Every year if you are 21 years old or older. Diabetes Have regular diabetes screenings. This checks your fasting blood sugar level. Have the screening done:  Once every three years after age 28 if you are at a normal weight and have a low risk for diabetes.  More often and at a younger age if you are overweight or have a high risk for diabetes. What should I know about preventing infection? Hepatitis B If you have a higher risk for hepatitis B, you should be screened for this virus. Talk with your health care provider to find out if you are at risk for hepatitis B infection. Hepatitis C Testing is recommended for:  Everyone born from [birth year] through 1964.  Anyone with known risk factors for hepatitis C. Sexually transmitted infections (STIs)  Get screened for STIs, including gonorrhea and chlamydia, if: ? You are sexually active and are younger than 43 years of age. ? You  are older than 43 years of age and your health care provider tells you that you are at risk for this type of infection. ? Your sexual activity has changed since you were last screened, and you are at increased risk for chlamydia or gonorrhea. Ask your health care provider if you are at risk.  Ask your health care provider about whether you are at high risk for HIV. Your health care provider may recommend a prescription medicine to help prevent HIV infection. If you choose to take medicine to prevent HIV, you should first get tested for HIV. You should then be tested every 3 months for as long as you are taking the medicine. Pregnancy  If you are about to stop having your period (premenopausal) and you may become pregnant, seek counseling before you get pregnant.  Take 400 to 800 micrograms (mcg) of folic acid every day if you become  pregnant.  Ask for birth control (contraception) if you want to prevent pregnancy. Osteoporosis and menopause Osteoporosis is a disease in which the bones lose minerals and strength with aging. This can result in bone fractures. If you are 69 years old or older, or if you are at risk for osteoporosis and fractures, ask your health care provider if you should:  Be screened for bone loss.  Take a calcium or vitamin D supplement to lower your risk of fractures.  Be given hormone replacement therapy (HRT) to treat symptoms of menopause. Follow these instructions at home: Lifestyle  Do not use any products that contain nicotine or tobacco, such as cigarettes, e-cigarettes, and chewing tobacco. If you need help quitting, ask your health care provider.  Do not use street drugs.  Do not share needles.  Ask your health care provider for help if you need support or information about quitting drugs. Alcohol use  Do not drink alcohol if: ? Your health care provider tells you not to drink. ? You are pregnant, may be pregnant, or are planning to become pregnant.  If you drink alcohol: ? Limit how much you use to 0-1 drink a day. ? Limit intake if you are breastfeeding.  Be aware of how much alcohol is in your drink. In the [place] one drink equals one 12 oz bottle of beer (355 mL), one 5 oz glass of wine (148 mL), or one 1 oz glass of hard liquor (44 mL). General instructions  Schedule regular health, dental, and eye exams.  Stay current with your vaccines.  Tell your health care provider if: ? You often feel depressed. ? You have ever been abused or do not feel safe at home. Summary  Adopting a healthy lifestyle and getting preventive care are important in promoting health and wellness.  Follow your health care provider's instructions about healthy diet, exercising, and getting tested or screened for diseases.  Follow your health care provider's instructions on monitoring your  cholesterol and blood pressure. This information is not intended to replace advice given to you by your health care provider. Make sure you discuss any questions you have with your health care provider. Document Revised: 12/15/2017 Document Reviewed: 12/15/2017 Elsevier Patient Education  2020 [employer].

## 2019-10-30 NOTE — Progress Notes (Signed)
Subjective:    Patient ID: Rebecca Dennis, female    DOB: 10/26/1975, 44 y.o.   MRN: [id number]  Chief Complaint  Patient presents with  . Fatigue    wants thyroid check it runs in her family   . Weight Gain    HPI Pt presents to the office today for CPE without pap. She is complaining of fatigue and weight gain. She reports she has a family history of hypothyroidism and wants this checked.   Pt denies any headache, palpitations, SOB, or edema at this time.   She reports about a 10 lb weight gain over the last year and is unsure if this is related to her age. She reports that she exercises on an elliptical  4 times a week for about 30 mins. She also reports she tries to eat a healthy balanced diet.   Review of Systems  Constitutional: Positive for fatigue.  All other systems reviewed and are negative.  History reviewed. No pertinent family history.  Social History   Socioeconomic History  . Marital status: Married    Spouse name: Not on file  . Number of children: Not on file  . Years of education: Not on file  . Highest education level: Not on file  Occupational History  . Not on file  Tobacco Use  . Smoking status: Never Smoker  . Smokeless tobacco: Never Used  Vaping Use  . Vaping Use: Never used  Substance and Sexual Activity  . Alcohol use: No    Alcohol/week: 0.0 standard drinks  . Drug use: No  . Sexual activity: Yes    Birth control/protection: Coitus interruptus  Other Topics Concern  . Not on file  Social History Narrative  . Not on file   Social Determinants of Health   Financial Resource Strain:   . Difficulty of Paying Living Expenses: Not on file  Food Insecurity:   . Worried About Charity fundraiser in the Last Year: Not on file  . Ran Out of Food in the Last Year: Not on file  Transportation Needs:   . Lack of Transportation (Medical): Not on file  . Lack of Transportation (Non-Medical): Not on file  Physical Activity:   . Days of Exercise  per Week: Not on file  . Minutes of Exercise per Session: Not on file  Stress:   . Feeling of Stress : Not on file  Social Connections:   . Frequency of Communication with Friends and Family: Not on file  . Frequency of Social Gatherings with Friends and Family: Not on file  . Attends Religious Services: Not on file  . Active Member of Clubs or Organizations: Not on file  . Attends Archivist Meetings: Not on file  . Marital Status: Not on file       Objective:   Physical Exam Vitals reviewed.  Constitutional:      General: She is not in acute distress.    Appearance: She is well-developed.  HENT:     Head: Normocephalic and atraumatic.     Right Ear: Tympanic membrane normal.     Left Ear: Tympanic membrane normal.  Eyes:     Pupils: Pupils are equal, round, and reactive to light.  Neck:     Thyroid: No thyromegaly.  Cardiovascular:     Rate and Rhythm: Normal rate and regular rhythm.     Heart sounds: Normal heart sounds. No murmur heard.   Pulmonary:     Effort: Pulmonary effort  is normal. No respiratory distress.     Breath sounds: Normal breath sounds. No wheezing.  Abdominal:     General: Bowel sounds are normal. There is no distension.     Palpations: Abdomen is soft.     Tenderness: There is no abdominal tenderness.  Musculoskeletal:        General: No tenderness. Normal range of motion.     Cervical back: Normal range of motion and neck supple.  Skin:    General: Skin is warm and dry.  Neurological:     Mental Status: She is alert and oriented to person, place, and time.     Cranial Nerves: No cranial nerve deficit.     Deep Tendon Reflexes: Reflexes are normal and symmetric.  Psychiatric:        Behavior: Behavior normal.        Thought Content: Thought content normal.        Judgment: Judgment normal.       BP 110/69   Pulse 67   Temp 97.9 F (36.6 C) (Temporal)   Ht 5' 5"  (1.651 m)   Wt 140 lb 12.8 oz (63.9 kg)   SpO2 99%   BMI  23.43 kg/m      Assessment & Plan:  Gwenn Teodoro comes in today with chief complaint of Fatigue (wants thyroid check it runs in her family ) and Weight Gain   Diagnosis and orders addressed:  1. Need for immunization against influenza - Flu Vaccine QUAD 36+ mos IM - CMP14+EGFR - CBC with Differential/Platelet  2. Annual physical exam - CMP14+EGFR - CBC with Differential/Platelet - Lipid panel - Thyroid Panel With TSH - Vitamin B12 - Hepatitis C antibody  3. Fatigue, unspecified type - CMP14+EGFR - CBC with Differential/Platelet - Thyroid Panel With TSH - Vitamin B12 - VITAMIN D 25 Hydroxy (Vit-D Deficiency, Fractures)  4. Weight gain - CMP14+EGFR - CBC with Differential/Platelet - Thyroid Panel With TSH  5. Need for hepatitis C screening test - CMP14+EGFR - CBC with Differential/Platelet - Hepatitis C antibody   Labs pending Health Maintenance reviewed Diet and exercise encouraged  Follow up plan: 1 year    Evelina Dun, FNP

## 2019-10-31 LAB — CBC WITH DIFFERENTIAL/PLATELET
Basophils Absolute: 0 10*3/uL (ref 0.0–0.2)
Basos: 1 %
EOS (ABSOLUTE): 0 10*3/uL (ref 0.0–0.4)
Eos: 1 %
Hematocrit: 37.6 % (ref 34.0–46.6)
Hemoglobin: 13 g/dL (ref 11.1–15.9)
Immature Grans (Abs): 0 10*3/uL (ref 0.0–0.1)
Immature Granulocytes: 0 %
Lymphocytes Absolute: 1.5 10*3/uL (ref 0.7–3.1)
Lymphs: 26 %
MCH: 31.6 pg (ref 26.6–33.0)
MCHC: 34.6 g/dL (ref 31.5–35.7)
MCV: 91 fL (ref 79–97)
Monocytes Absolute: 0.5 10*3/uL (ref 0.1–0.9)
Monocytes: 9 %
Neutrophils Absolute: 3.6 10*3/uL (ref 1.4–7.0)
Neutrophils: 63 %
Platelets: 298 10*3/uL (ref 150–450)
RBC: 4.12 x10E6/uL (ref 3.77–5.28)
RDW: 11.5 % — ABNORMAL LOW (ref 11.7–15.4)
WBC: 5.7 10*3/uL (ref 3.4–10.8)

## 2019-10-31 LAB — HEPATITIS C ANTIBODY: Hep C Virus Ab: 0.1 s/co ratio (ref 0.0–0.9)

## 2019-10-31 LAB — CMP14+EGFR
ALT: 7 IU/L (ref 0–32)
AST: 18 IU/L (ref 0–40)
Albumin/Globulin Ratio: 1.4 (ref 1.2–2.2)
Albumin: 4.3 g/dL (ref 3.8–4.8)
Alkaline Phosphatase: 48 IU/L (ref 44–121)
BUN/Creatinine Ratio: 14 (ref 9–23)
BUN: 13 mg/dL (ref 6–24)
Bilirubin Total: 0.4 mg/dL (ref 0.0–1.2)
CO2: 22 mmol/L (ref 20–29)
Calcium: 9.2 mg/dL (ref 8.7–10.2)
Chloride: 103 mmol/L (ref 96–106)
Creatinine, Ser: 0.93 mg/dL (ref 0.57–1.00)
GFR calc Af Amer: 87 mL/min/{1.73_m2} (ref >59)
GFR calc non Af Amer: 76 mL/min/{1.73_m2} (ref >59)
Globulin, Total: 3 g/dL (ref 1.5–4.5)
Glucose: 87 mg/dL (ref 65–99)
Potassium: 4.3 mmol/L (ref 3.5–5.2)
Sodium: 137 mmol/L (ref 134–144)
Total Protein: 7.3 g/dL (ref 6.0–8.5)

## 2019-10-31 LAB — THYROID PANEL WITH TSH
Free Thyroxine Index: 2.1 (ref 1.2–4.9)
T3 Uptake Ratio: 27 % (ref 24–39)
T4, Total: 7.7 ug/dL (ref 4.5–12.0)
TSH: 3.51 u[IU]/mL (ref 0.450–4.500)

## 2019-10-31 LAB — LIPID PANEL
Chol/HDL Ratio: 1.9 ratio (ref 0.0–4.4)
Cholesterol, Total: 158 mg/dL (ref 100–199)
HDL: 82 mg/dL (ref >39)
LDL Chol Calc (NIH): 66 mg/dL (ref 0–99)
Triglycerides: 44 mg/dL (ref 0–149)
VLDL Cholesterol Cal: 10 mg/dL (ref 5–40)

## 2019-10-31 LAB — VITAMIN D 25 HYDROXY (VIT D DEFICIENCY, FRACTURES): Vit D, 25-Hydroxy: 40.9 ng/mL (ref 30.0–100.0)

## 2019-10-31 LAB — VITAMIN B12: Vitamin B-12: 436 pg/mL (ref 232–1245)

## 2020-02-21 ENCOUNTER — Ambulatory Visit (INDEPENDENT_AMBULATORY_CARE_PROVIDER_SITE_OTHER): Payer: 59 | Admitting: Family Medicine

## 2020-02-21 DIAGNOSIS — J019 Acute sinusitis, unspecified: Secondary | ICD-10-CM

## 2020-02-21 DIAGNOSIS — B9689 Other specified bacterial agents as the cause of diseases classified elsewhere: Secondary | ICD-10-CM | POA: Diagnosis not present

## 2020-02-21 MED ORDER — AMOXICILLIN-POT CLAVULANATE 875-125 MG PO TABS: 1.0000 | ORAL_TABLET | Freq: Two times a day (BID) | ORAL | 0 refills | Status: DC

## 2020-02-21 MED ORDER — FLUCONAZOLE 150 MG PO TABS: 150.0000 mg | ORAL_TABLET | Freq: Once | ORAL | 0 refills | Status: AC

## 2020-02-21 NOTE — Progress Notes (Signed)
Telephone visit  Subjective: CC: URI PCP: Rebecca Spencer, FNP [name] is a 45 y.o. female calls for telephone consult today. Patient provides verbal consent for consult held via phone.  Due to COVID-19 pandemic this visit was conducted virtually. This visit type was conducted due to national recommendations for restrictions regarding the COVID-19 Pandemic (e.g. social distancing, sheltering in place) in an effort to limit this patient's exposure and mitigate transmission in our community. All issues noted in this document were discussed and addressed.  A physical exam was not performed with this format.   Location of patient: car Location of provider: [facility] Others present for call: none  1. COVID Patient reports URI symptoms with COVID19 about 3 weeks ago.  She recovered from that and now is having sinusitis and post nasal drip.  She reports low grade fever.  She reports some bloody discharge from the sinuses.  She is using allergy medications for sinuses and nasacort.  She is having both nasal congestion and sinus pressure.  She is using generic pseudofed as well.   ROS: Per HPI  No Known Allergies No past medical history on file.  Current Outpatient Medications:  Rebecca Dennis  Multiple Vitamin (MULTIVITAMIN) tablet, Take 1 tablet by mouth daily. (Patient not taking: Reported on 10/30/2019), Disp: , Rfl:   Assessment/ Plan: 45 y.o. female   Acute bacterial sinusitis - Plan: fluconazole (DIFLUCAN) 150 MG tablet, amoxicillin-clavulanate (AUGMENTIN) 875-125 MG tablet  I suspect given her "second sickening" that she likely has a bacterial sinusitis.  I am sending in Augmentin empirically as well as a Diflucan should she need for yeast vaginitis.  We discussed holding off on Nasacort as this may be the etiology of her nasal bleeding and instead using a saline spray.  She may continue all other medications if needed.  She will follow-up if symptoms do not improve or if they worsen.  Start  time: 3:21pm End time: 3:26pm  Total time spent on patient care (including telephone call/ virtual visit): 5 minutes  Wilbur Labuda Hulen Skains, DO [facility] [phone]

## 2020-04-05 ENCOUNTER — Other Ambulatory Visit (HOSPITAL_COMMUNITY): Payer: Self-pay | Admitting: Family

## 2020-04-05 DIAGNOSIS — Z1231 Encounter for screening mammogram for malignant neoplasm of breast: Secondary | ICD-10-CM

## 2020-04-18 ENCOUNTER — Ambulatory Visit (INDEPENDENT_AMBULATORY_CARE_PROVIDER_SITE_OTHER): Payer: 59 | Admitting: Family Medicine

## 2020-04-18 ENCOUNTER — Encounter: Payer: Self-pay | Admitting: Family Medicine

## 2020-04-18 DIAGNOSIS — S30860A Insect bite (nonvenomous) of lower back and pelvis, initial encounter: Secondary | ICD-10-CM | POA: Diagnosis not present

## 2020-04-18 DIAGNOSIS — W57XXXA Bitten or stung by nonvenomous insect and other nonvenomous arthropods, initial encounter: Secondary | ICD-10-CM

## 2020-04-18 MED ORDER — FLUCONAZOLE 150 MG PO TABS: 150.0000 mg | ORAL_TABLET | Freq: Once | ORAL | 0 refills | Status: AC

## 2020-04-18 MED ORDER — DOXYCYCLINE HYCLATE 100 MG PO TABS: 100.0000 mg | ORAL_TABLET | Freq: Two times a day (BID) | ORAL | 0 refills | Status: AC

## 2020-04-18 NOTE — Progress Notes (Signed)
   Virtual Visit via Telephone Note  I connected with Rebecca Dennis on 04/18/20 at 3:57 PM by telephone and verified that I am speaking with the correct person using two identifiers. Rebecca Dennis is currently located at work and nobody is currently with her during this visit. The provider, Gwenlyn Fudge, FNP is located in their office at time of visit.  I discussed the limitations, risks, security and privacy concerns of performing an evaluation and management service by telephone and the availability of in person appointments. I also discussed with the patient that there may be a patient responsible charge related to this service. The patient expressed understanding and agreed to proceed.  Subjective: PCP: Junie Spencer, FNP  Chief Complaint  Patient presents with  . Bite   Patient reports she pulled a tick off of her lower back a couple of days ago.  She reports it is swollen and itchy.  She is more concerned about the red ring around it.  She denies headache, fever, nausea, vomiting, or increased joint pain.   ROS: Per HPI  Current Outpatient Medications:  Marland Kitchen  Multiple Vitamin (MULTIVITAMIN) tablet, Take 1 tablet by mouth daily. (Patient not taking: Reported on 10/30/2019), Disp: , Rfl:   No Known Allergies History reviewed. No pertinent past medical history.  Observations/Objective: A&O  No respiratory distress or wheezing audible over the phone Mood, judgement, and thought processes all WNL   Assessment and Plan: 1. Tick bite of lower back, initial encounter Patient requested a prescription for Diflucan as she reports she always gets a yeast infection when she takes antibiotics.  Advised not to take the Diflucan unless she develops symptoms. - doxycycline (VIBRA-TABS) 100 MG tablet; Take 1 tablet (100 mg total) by mouth 2 (two) times daily for 10 days.  Dispense: 20 tablet; Refill: 0 - fluconazole (DIFLUCAN) 150 MG tablet; Take 1 tablet (150 mg total) by mouth once for 1  dose. May repeat after 3 days if needed.  Dispense: 2 tablet; Refill: 0   Follow Up Instructions:  I discussed the assessment and treatment plan with the patient. The patient was provided an opportunity to ask questions and all were answered. The patient agreed with the plan and demonstrated an understanding of the instructions.   The patient was advised to call back or seek an in-person evaluation if the symptoms worsen or if the condition fails to improve as anticipated.  The above assessment and management plan was discussed with the patient. The patient verbalized understanding of and has agreed to the management plan. Patient is aware to call the clinic if symptoms persist or worsen. Patient is aware when to return to the clinic for a follow-up visit. Patient educated on when it is appropriate to go to the emergency department.   Time call ended: 4:02 PM  I provided 5 minutes of non-face-to-face time during this encounter.  Deliah Boston, MSN, APRN, FNP-C [facility] 04/18/20

## 2020-04-22 ENCOUNTER — Other Ambulatory Visit: Payer: Self-pay

## 2020-04-22 ENCOUNTER — Ambulatory Visit (HOSPITAL_COMMUNITY)
Admission: RE | Admit: 2020-04-22 | Discharge: 2020-04-22 | Disposition: A | Discharge status: Home / Self Care | Payer: 59 | Source: Ambulatory Visit | Attending: Family | Admitting: Family

## 2020-04-22 DIAGNOSIS — Z1231 Encounter for screening mammogram for malignant neoplasm of breast: Secondary | ICD-10-CM | POA: Diagnosis not present

## 2020-06-18 ENCOUNTER — Ambulatory Visit (INDEPENDENT_AMBULATORY_CARE_PROVIDER_SITE_OTHER): Payer: 59 | Admitting: Family

## 2020-06-18 ENCOUNTER — Encounter: Payer: Self-pay | Admitting: Family

## 2020-06-18 DIAGNOSIS — H65111 Acute and subacute allergic otitis media (mucoid) (sanguinous) (serous), right ear: Secondary | ICD-10-CM | POA: Diagnosis not present

## 2020-06-18 MED ORDER — CETIRIZINE HCL 10 MG PO TABS: 10.0000 mg | ORAL_TABLET | Freq: Every day | ORAL | 11 refills | Status: AC

## 2020-06-18 MED ORDER — FLUTICASONE PROPIONATE 50 MCG/ACT NA SUSP: 2.0000 | Freq: Every day | NASAL | 6 refills | Status: DC

## 2020-06-18 MED ORDER — AMOXICILLIN 875 MG PO TABS: 875.0000 mg | ORAL_TABLET | Freq: Two times a day (BID) | ORAL | 0 refills | Status: DC

## 2020-06-18 NOTE — Progress Notes (Signed)
   Virtual Visit  Note Due to COVID-19 pandemic this visit was conducted virtually. This visit type was conducted due to national recommendations for restrictions regarding the COVID-19 Pandemic (e.g. social distancing, sheltering in place) in an effort to limit this patient's exposure and mitigate transmission in our community. All issues noted in this document were discussed and addressed.  A physical exam was not performed with this format.  I connected with Rebecca Dennis on 06/18/20 at 10:19 AM  by telephone and verified that I am speaking with the correct person using two identifiers. Rebecca Dennis is currently located at home and no one is currently with her during visit. The provider, Jannifer Rodney, FNP is located in their office at time of visit.  I discussed the limitations, risks, security and privacy concerns of performing an evaluation and management service by telephone and the availability of in person appointments. I also discussed with the patient that there may be a patient responsible charge related to this service. The patient expressed understanding and agreed to proceed.   History and Present Illness:  Ear Fullness  There is pain in the right ear. This is a new problem. The current episode started in the past 7 days. The problem has been gradually worsening. There has been no fever. The pain is at a severity of 7/10. The pain is moderate. Associated symptoms include headaches and hearing loss. Pertinent negatives include no coughing, ear discharge, rhinorrhea or sore throat. She has tried acetaminophen for the symptoms. The treatment provided mild relief.     Review of Systems  HENT:  Positive for hearing loss. Negative for ear discharge, rhinorrhea and sore throat.   Respiratory:  Negative for cough.   Neurological:  Positive for headaches.    Observations/Objective: No SOB or distress noted  Assessment and Plan: 1. Acute allergic otitis media of right ear, recurrence not  specified Force fluids Tylenol as needed Zyrtec and flonase daily RTO if symptoms worsen or do not improve  - cetirizine (ZYRTEC) 10 MG tablet; Take 1 tablet (10 mg total) by mouth daily.  Dispense: 30 tablet; Refill: 11 - fluticasone (FLONASE) 50 MCG/ACT nasal spray; Place 2 sprays into both nostrils daily.  Dispense: 16 g; Refill: 6 - amoxicillin (AMOXIL) 875 MG tablet; Take 1 tablet (875 mg total) by mouth 2 (two) times daily.  Dispense: 14 tablet; Refill: 0     I discussed the assessment and treatment plan with the patient. The patient was provided an opportunity to ask questions and all were answered. The patient agreed with the plan and demonstrated an understanding of the instructions.   The patient was advised to call back or seek an in-person evaluation if the symptoms worsen or if the condition fails to improve as anticipated.  The above assessment and management plan was discussed with the patient. The patient verbalized understanding of and has agreed to the management plan. Patient is aware to call the clinic if symptoms persist or worsen. Patient is aware when to return to the clinic for a follow-up visit. Patient educated on when it is appropriate to go to the emergency department.   Time call ended:  10:30 AM   I provided 11 minutes of  non face-to-face time during this encounter.    Jannifer Rodney, FNP

## 2020-10-28 ENCOUNTER — Encounter: Payer: Self-pay | Admitting: Family

## 2020-10-28 ENCOUNTER — Other Ambulatory Visit: Payer: Self-pay

## 2020-10-28 ENCOUNTER — Ambulatory Visit (INDEPENDENT_AMBULATORY_CARE_PROVIDER_SITE_OTHER): Payer: 59 | Admitting: Family

## 2020-10-28 VITALS — BP 112/76 | HR 76 | Temp 98.3°F | Ht 65.0 in | Wt 142.4 lb

## 2020-10-28 DIAGNOSIS — E559 Vitamin D deficiency, unspecified: Secondary | ICD-10-CM

## 2020-10-28 DIAGNOSIS — Z0001 Encounter for general adult medical examination with abnormal findings: Secondary | ICD-10-CM | POA: Diagnosis not present

## 2020-10-28 DIAGNOSIS — Z0000 Encounter for general adult medical examination without abnormal findings: Secondary | ICD-10-CM

## 2020-10-28 NOTE — Patient Instructions (Signed)
Health Maintenance, Female Adopting a healthy lifestyle and getting preventive care are important in promoting health and wellness. Ask your health care provider about: The right schedule for you to have regular tests and exams. Things you can do on your own to prevent diseases and keep yourself healthy. What should I know about diet, weight, and exercise? Eat a healthy diet  Eat a diet that includes plenty of vegetables, fruits, low-fat dairy products, and lean protein. Do not eat a lot of foods that are high in solid fats, added sugars, or sodium. Maintain a healthy weight Body mass index (BMI) is used to identify weight problems. It estimates body fat based on height and weight. Your health care provider can help determine your BMI and help you achieve or maintain a healthy weight. Get regular exercise Get regular exercise. This is one of the most important things you can do for your health. Most adults should: Exercise for at least 150 minutes each week. The exercise should increase your heart rate and make you sweat (moderate-intensity exercise). Do strengthening exercises at least twice a week. This is in addition to the moderate-intensity exercise. Spend less time sitting. Even light physical activity can be beneficial. Watch cholesterol and blood lipids Have your blood tested for lipids and cholesterol at 44 years of age, then have this test every 5 years. Have your cholesterol levels checked more often if: Your lipid or cholesterol levels are high. You are older than 44 years of age. You are at high risk for heart disease. What should I know about cancer screening? Depending on your health history and family history, you may need to have cancer screening at various ages. This may include screening for: Breast cancer. Cervical cancer. Colorectal cancer. Skin cancer. Lung cancer. What should I know about heart disease, diabetes, and high blood pressure? Blood pressure and heart  disease High blood pressure causes heart disease and increases the risk of stroke. This is more likely to develop in people who have high blood pressure readings, are of African descent, or are overweight. Have your blood pressure checked: Every 3-5 years if you are 18-39 years of age. Every year if you are 40 years old or older. Diabetes Have regular diabetes screenings. This checks your fasting blood sugar level. Have the screening done: Once every three years after age 40 if you are at a normal weight and have a low risk for diabetes. More often and at a younger age if you are overweight or have a high risk for diabetes. What should I know about preventing infection? Hepatitis B If you have a higher risk for hepatitis B, you should be screened for this virus. Talk with your health care provider to find out if you are at risk for hepatitis B infection. Hepatitis C Testing is recommended for: Everyone born from 1945 through 1965. Anyone with known risk factors for hepatitis C. Sexually transmitted infections (STIs) Get screened for STIs, including gonorrhea and chlamydia, if: You are sexually active and are younger than 44 years of age. You are older than 44 years of age and your health care provider tells you that you are at risk for this type of infection. Your sexual activity has changed since you were last screened, and you are at increased risk for chlamydia or gonorrhea. Ask your health care provider if you are at risk. Ask your health care provider about whether you are at high risk for HIV. Your health care provider may recommend a prescription medicine   to help prevent HIV infection. If you choose to take medicine to prevent HIV, you should first get tested for HIV. You should then be tested every 3 months for as long as you are taking the medicine. Pregnancy If you are about to stop having your period (premenopausal) and you may become pregnant, seek counseling before you get  pregnant. Take 400 to 800 micrograms (mcg) of folic acid every day if you become pregnant. Ask for birth control (contraception) if you want to prevent pregnancy. Osteoporosis and menopause Osteoporosis is a disease in which the bones lose minerals and strength with aging. This can result in bone fractures. If you are 65 years old or older, or if you are at risk for osteoporosis and fractures, ask your health care provider if you should: Be screened for bone loss. Take a calcium or vitamin D supplement to lower your risk of fractures. Be given hormone replacement therapy (HRT) to treat symptoms of menopause. Follow these instructions at home: Lifestyle Do not use any products that contain nicotine or tobacco, such as cigarettes, e-cigarettes, and chewing tobacco. If you need help quitting, ask your health care provider. Do not use street drugs. Do not share needles. Ask your health care provider for help if you need support or information about quitting drugs. Alcohol use Do not drink alcohol if: Your health care provider tells you not to drink. You are pregnant, may be pregnant, or are planning to become pregnant. If you drink alcohol: Limit how much you use to 0-1 drink a day. Limit intake if you are breastfeeding. Be aware of how much alcohol is in your drink. In the [place] one drink equals one 12 oz bottle of beer (355 mL), one 5 oz glass of wine (148 mL), or one 1 oz glass of hard liquor (44 mL). General instructions Schedule regular health, dental, and eye exams. Stay current with your vaccines. Tell your health care provider if: You often feel depressed. You have ever been abused or do not feel safe at home. Summary Adopting a healthy lifestyle and getting preventive care are important in promoting health and wellness. Follow your health care provider's instructions about healthy diet, exercising, and getting tested or screened for diseases. Follow your health care provider's  instructions on monitoring your cholesterol and blood pressure. This information is not intended to replace advice given to you by your health care provider. Make sure you discuss any questions you have with your health care provider. Document Revised: 03/01/2020 Document Reviewed: 12/15/2017 Elsevier Patient Education  2022 Elsevier Inc.  

## 2020-10-28 NOTE — Progress Notes (Signed)
Subjective:    Patient ID: Rebecca Dennis, female    DOB: Apr 11, 1975, 45 y.o.   MRN: [id number]  Chief Complaint  Patient presents with   Annual Exam     HPI PT presents to the office today for CPE without pap. PT is currently taking flonase and zyrtec daily. Pt denies any headache, palpitations, SOB, or edema at this time.    Review of Systems  All other systems reviewed and are negative.  History reviewed. No pertinent family history. Social History   Socioeconomic History   Marital status: Married    Spouse name: Not on file   Number of children: Not on file   Years of education: Not on file   Highest education level: Not on file  Occupational History   Not on file  Tobacco Use   Smoking status: Never   Smokeless tobacco: Never  Vaping Use   Vaping Use: Never used  Substance and Sexual Activity   Alcohol use: No    Alcohol/week: 0.0 standard drinks   Drug use: No   Sexual activity: Yes    Birth control/protection: Coitus interruptus  Other Topics Concern   Not on file  Social History Narrative   Not on file   Social Determinants of Health   Financial Resource Strain: Not on file  Food Insecurity: Not on file  Transportation Needs: Not on file  Physical Activity: Not on file  Stress: Not on file  Social Connections: Not on file       Objective:   Physical Exam Vitals reviewed.  Constitutional:      General: She is not in acute distress.    Appearance: She is well-developed.  HENT:     Head: Normocephalic and atraumatic.     Right Ear: Tympanic membrane normal.     Left Ear: Tympanic membrane normal.  Eyes:     Pupils: Pupils are equal, round, and reactive to light.  Neck:     Thyroid: No thyromegaly.  Cardiovascular:     Rate and Rhythm: Normal rate and regular rhythm.     Heart sounds: Normal heart sounds. No murmur heard. Pulmonary:     Effort: Pulmonary effort is normal. No respiratory distress.     Breath sounds: Normal breath sounds. No  wheezing.  Abdominal:     General: Bowel sounds are normal. There is no distension.     Palpations: Abdomen is soft.     Tenderness: There is no abdominal tenderness.  Musculoskeletal:        General: No tenderness. Normal range of motion.     Cervical back: Normal range of motion and neck supple.  Skin:    General: Skin is warm and dry.  Neurological:     Mental Status: She is alert and oriented to person, place, and time.     Cranial Nerves: No cranial nerve deficit.     Deep Tendon Reflexes: Reflexes are normal and symmetric.  Psychiatric:        Behavior: Behavior normal.        Thought Content: Thought content normal.        Judgment: Judgment normal.    BP 112/76   Pulse 76   Temp 98.3 F (36.8 C) (Temporal)   Ht _0  (1.651 m)   Wt 142 lb 6.4 oz (64.6 kg)   BMI 23.70 kg/m       Assessment & Plan:  Rebecca Dennis comes in today with chief complaint of Annual Exam  Diagnosis and orders addressed:  1. Annual physical exam - CMP14+EGFR - CBC with Differential/Platelet - Lipid panel - TSH - VITAMIN D 25 Hydroxy (Vit-D Deficiency, Fractures)  2. Vitamin D deficiency - VITAMIN D 25 Hydroxy (Vit-D Deficiency, Fractures)   Labs pending Health Maintenance reviewed Diet and exercise encouraged  Follow up plan: 1 year    Evelina Dun, FNP

## 2020-10-29 LAB — LIPID PANEL
Chol/HDL Ratio: 2 ratio (ref 0.0–4.4)
Cholesterol, Total: 162 mg/dL (ref 100–199)
HDL: 82 mg/dL (ref >39)
LDL Chol Calc (NIH): 69 mg/dL (ref 0–99)
Triglycerides: 53 mg/dL (ref 0–149)
VLDL Cholesterol Cal: 11 mg/dL (ref 5–40)

## 2020-10-29 LAB — CMP14+EGFR
ALT: 7 IU/L (ref 0–32)
AST: 20 IU/L (ref 0–40)
Albumin/Globulin Ratio: 1.8 (ref 1.2–2.2)
Albumin: 4.4 g/dL (ref 3.8–4.8)
Alkaline Phosphatase: 55 IU/L (ref 44–121)
BUN/Creatinine Ratio: 18 (ref 9–23)
BUN: 15 mg/dL (ref 6–24)
Bilirubin Total: 0.3 mg/dL (ref 0.0–1.2)
CO2: 19 mmol/L — ABNORMAL LOW (ref 20–29)
Calcium: 9.5 mg/dL (ref 8.7–10.2)
Chloride: 104 mmol/L (ref 96–106)
Creatinine, Ser: 0.83 mg/dL (ref 0.57–1.00)
Globulin, Total: 2.5 g/dL (ref 1.5–4.5)
Glucose: 94 mg/dL (ref 70–99)
Potassium: 4.3 mmol/L (ref 3.5–5.2)
Sodium: 138 mmol/L (ref 134–144)
Total Protein: 6.9 g/dL (ref 6.0–8.5)
eGFR: 89 mL/min/{1.73_m2} (ref >59)

## 2020-10-29 LAB — CBC WITH DIFFERENTIAL/PLATELET
Basophils Absolute: 0 10*3/uL (ref 0.0–0.2)
Basos: 1 %
EOS (ABSOLUTE): 0 10*3/uL (ref 0.0–0.4)
Eos: 1 %
Hematocrit: 38.9 % (ref 34.0–46.6)
Hemoglobin: 12.8 g/dL (ref 11.1–15.9)
Immature Grans (Abs): 0 10*3/uL (ref 0.0–0.1)
Immature Granulocytes: 0 %
Lymphocytes Absolute: 1.4 10*3/uL (ref 0.7–3.1)
Lymphs: 22 %
MCH: 30.3 pg (ref 26.6–33.0)
MCHC: 32.9 g/dL (ref 31.5–35.7)
MCV: 92 fL (ref 79–97)
Monocytes Absolute: 0.6 10*3/uL (ref 0.1–0.9)
Monocytes: 9 %
Neutrophils Absolute: 4.4 10*3/uL (ref 1.4–7.0)
Neutrophils: 67 %
Platelets: 316 10*3/uL (ref 150–450)
RBC: 4.22 x10E6/uL (ref 3.77–5.28)
RDW: 12.2 % (ref 11.7–15.4)
WBC: 6.5 10*3/uL (ref 3.4–10.8)

## 2020-10-29 LAB — VITAMIN D 25 HYDROXY (VIT D DEFICIENCY, FRACTURES): Vit D, 25-Hydroxy: 43 ng/mL (ref 30.0–100.0)

## 2020-10-29 LAB — TSH: TSH: 1.9 u[IU]/mL (ref 0.450–4.500)

## 2021-01-13 ENCOUNTER — Ambulatory Visit (INDEPENDENT_AMBULATORY_CARE_PROVIDER_SITE_OTHER): Payer: 59 | Admitting: Family

## 2021-01-13 ENCOUNTER — Encounter: Payer: Self-pay | Admitting: Family

## 2021-01-13 DIAGNOSIS — J019 Acute sinusitis, unspecified: Secondary | ICD-10-CM | POA: Diagnosis not present

## 2021-01-13 MED ORDER — FLUCONAZOLE 150 MG PO TABS
150.0000 mg | ORAL_TABLET | ORAL | 0 refills | Status: DC | PRN
Start: 2021-01-13 — End: 2022-03-30

## 2021-01-13 MED ORDER — AMOXICILLIN-POT CLAVULANATE 875-125 MG PO TABS: 1.0000 | ORAL_TABLET | Freq: Two times a day (BID) | ORAL | 0 refills | Status: DC

## 2021-01-13 NOTE — Progress Notes (Signed)
Virtual Visit  Note Due to COVID-19 pandemic this visit was conducted virtually. This visit type was conducted due to national recommendations for restrictions regarding the COVID-19 Pandemic (e.g. social distancing, sheltering in place) in an effort to limit this patient's exposure and mitigate transmission in our community. All issues noted in this document were discussed and addressed.  A physical exam was not performed with this format.  I connected with Rebecca Dennis on 01/13/21 at 10:29 AM  by telephone and verified that I am speaking with the correct person using two identifiers. Rebecca Dennis is currently located at home and no one is currently with her during visit. The provider, Jannifer Rodney, FNP is located in their office at time of visit.  I discussed the limitations, risks, security and privacy concerns of performing an evaluation and management service by telephone and the availability of in person appointments. I also discussed with the patient that there may be a patient responsible charge related to this service. The patient expressed understanding and agreed to proceed.  Rebecca Dennis, Rebecca Dennis are scheduled for a virtual visit with your provider today.    Just as we do with appointments in the office, we must obtain your consent to participate.  Your consent will be active for this visit and any virtual visit you may have with one of our providers in the next 365 days.    If you have a MyChart account, I can also send a copy of this consent to you electronically.  All virtual visits are billed to your insurance company just like a traditional visit in the office.  As this is a virtual visit, video technology does not allow for your provider to perform a traditional examination.  This may limit your provider's ability to fully assess your condition.  If your provider identifies any concerns that need to be evaluated in person or the need to arrange testing such as labs, EKG, etc, we will make  arrangements to do so.    Although advances in technology are sophisticated, we cannot ensure that it will always work on either your end or our end.  If the connection with a video visit is poor, we may have to switch to a telephone visit.  With either a video or telephone visit, we are not always able to ensure that we have a secure connection.   I need to obtain your verbal consent now.   Are you willing to proceed with your visit today?   Rebecca Dennis has provided verbal consent on 01/13/2021 for a virtual visit (video or telephone).   Jannifer Rodney, [place] 01/13/2021  10:39 AM   History and Present Illness:  Sinusitis This is a new problem. The current episode started 1 to 4 weeks ago. The problem has been gradually worsening since onset. There has been no fever. Associated symptoms include congestion, ear pain (pressure), headaches and sinus pressure. Pertinent negatives include no chills, coughing, shortness of breath, sneezing or sore throat. (Post nasal drainage ) Past treatments include oral decongestants. The treatment provided mild relief.     Review of Systems  Constitutional:  Negative for chills.  HENT:  Positive for congestion, ear pain (pressure) and sinus pressure. Negative for sneezing and sore throat.   Respiratory:  Negative for cough and shortness of breath.   Neurological:  Positive for headaches.    Observations/Objective: No SOB or distress noted   Assessment and Plan: 1. Acute sinusitis, recurrence not specified, unspecified location Continue Flonase and Zyrtec She  reports she has tried multiple OTC allergy medications with mild relief. Would like a referral for a specialists.  Avoid allergens  Force fluids  - amoxicillin-clavulanate (AUGMENTIN) 875-125 MG tablet; Take 1 tablet by mouth 2 (two) times daily.  Dispense: 14 tablet; Refill: 0 - fluconazole (DIFLUCAN) 150 MG tablet; Take 1 tablet (150 mg total) by mouth every three (3) days as needed.  Dispense: 2  tablet; Refill: 0 - Ambulatory referral to Allergy    I discussed the assessment and treatment plan with the patient. The patient was provided an opportunity to ask questions and all were answered. The patient agreed with the plan and demonstrated an understanding of the instructions.   The patient was advised to call back or seek an in-person evaluation if the symptoms worsen or if the condition fails to improve as anticipated.  The above assessment and management plan was discussed with the patient. The patient verbalized understanding of and has agreed to the management plan. Patient is aware to call the clinic if symptoms persist or worsen. Patient is aware when to return to the clinic for a follow-up visit. Patient educated on when it is appropriate to go to the emergency department.   Time call ended:  10:40 AM   I provided 11 minutes of  non face-to-face time during this encounter.    Jannifer Rodney, FNP

## 2022-03-05 ENCOUNTER — Encounter: Payer: 59 | Admitting: Family

## 2022-03-23 ENCOUNTER — Encounter: Payer: 59 | Admitting: Family

## 2022-03-30 ENCOUNTER — Encounter: Payer: Self-pay | Admitting: Family

## 2022-03-30 ENCOUNTER — Ambulatory Visit (INDEPENDENT_AMBULATORY_CARE_PROVIDER_SITE_OTHER): Payer: 59 | Admitting: Family

## 2022-03-30 ENCOUNTER — Other Ambulatory Visit (HOSPITAL_COMMUNITY)
Admission: RE | Admit: 2022-03-30 | Discharge: 2022-03-30 | Disposition: A | Discharge status: Home / Self Care | Payer: 59 | Source: Ambulatory Visit | Attending: Family | Admitting: Family

## 2022-03-30 VITALS — BP 131/85 | HR 69 | Temp 98.0°F | Ht 65.0 in | Wt 139.0 lb

## 2022-03-30 DIAGNOSIS — S30861A Insect bite (nonvenomous) of abdominal wall, initial encounter: Secondary | ICD-10-CM

## 2022-03-30 DIAGNOSIS — Z0001 Encounter for general adult medical examination with abnormal findings: Secondary | ICD-10-CM

## 2022-03-30 DIAGNOSIS — Z0000 Encounter for general adult medical examination without abnormal findings: Secondary | ICD-10-CM

## 2022-03-30 DIAGNOSIS — W57XXXA Bitten or stung by nonvenomous insect and other nonvenomous arthropods, initial encounter: Secondary | ICD-10-CM

## 2022-03-30 DIAGNOSIS — Z23 Encounter for immunization: Secondary | ICD-10-CM

## 2022-03-30 DIAGNOSIS — R239 Unspecified skin changes: Secondary | ICD-10-CM

## 2022-03-30 DIAGNOSIS — Z01419 Encounter for gynecological examination (general) (routine) without abnormal findings: Secondary | ICD-10-CM

## 2022-03-30 DIAGNOSIS — B351 Tinea unguium: Secondary | ICD-10-CM | POA: Diagnosis not present

## 2022-03-30 DIAGNOSIS — E559 Vitamin D deficiency, unspecified: Secondary | ICD-10-CM

## 2022-03-30 MED ORDER — DOXYCYCLINE HYCLATE 100 MG PO TABS: 200.0000 mg | ORAL_TABLET | Freq: Once | ORAL | 0 refills | Status: AC

## 2022-03-30 MED ORDER — TERBINAFINE HCL 250 MG PO TABS: 250.0000 mg | ORAL_TABLET | Freq: Every day | ORAL | 0 refills | Status: DC

## 2022-03-30 NOTE — Progress Notes (Signed)
Subjective:    Patient ID: Rebecca Dennis, female    DOB: 12-11-1975, 47 y.o.   MRN: [id number]  Chief Complaint  Patient presents with   Annual Exam   Tick Removal    She was bit yesterday   Pt presents to the office today for CPE with pap. She takes zyrtec 10 mg daily for allergies. Stable.   She reports she removed a tick on her left side that was attached approx 6 hours. No fevers, joint pain, rash,or headache.   Complaining of discoloration and thickness of her toenails for several months.   She is also complaining of redness on her nose and requesting referral to dermatologists.  Gynecologic Exam The patient's pertinent negatives include no genital itching, genital odor, vaginal bleeding or vaginal discharge. The problem has been unchanged. The patient is experiencing no pain.      Review of Systems  Genitourinary:  Negative for vaginal discharge.  All other systems reviewed and are negative.   History reviewed. No pertinent family history. Social History   Socioeconomic History   Marital status: Married    Spouse name: Not on file   Number of children: Not on file   Years of education: Not on file   Highest education level: Not on file  Occupational History   Not on file  Tobacco Use   Smoking status: Never   Smokeless tobacco: Never  Vaping Use   Vaping Use: Never used  Substance and Sexual Activity   Alcohol use: No    Alcohol/week: 0.0 standard drinks of alcohol   Drug use: No   Sexual activity: Yes    Birth control/protection: Coitus interruptus  Other Topics Concern   Not on file  Social History Narrative   Not on file   Social Determinants of Health   Financial Resource Strain: Not on file  Food Insecurity: Not on file  Transportation Needs: Not on file  Physical Activity: Not on file  Stress: Not on file  Social Connections: Not on file       Objective:   Physical Exam Vitals reviewed.  Constitutional:      General: She is not in  acute distress.    Appearance: She is well-developed.  HENT:     Head: Normocephalic and atraumatic.     Right Ear: Tympanic membrane normal.     Left Ear: Tympanic membrane normal.  Eyes:     Pupils: Pupils are equal, round, and reactive to light.  Neck:     Thyroid: No thyromegaly.  Cardiovascular:     Rate and Rhythm: Normal rate and regular rhythm.     Heart sounds: Normal heart sounds. No murmur heard. Pulmonary:     Effort: Pulmonary effort is normal. No respiratory distress.     Breath sounds: Normal breath sounds. No wheezing.  Chest:  Breasts:    Right: No swelling, bleeding, inverted nipple, mass, nipple discharge, skin change or tenderness.     Left: No swelling, bleeding, inverted nipple, mass, nipple discharge, skin change or tenderness.  Abdominal:     General: Bowel sounds are normal. There is no distension.     Palpations: Abdomen is soft.     Tenderness: There is no abdominal tenderness.  Genitourinary:    Comments: Bimanual exam- no adnexal masses or tenderness, ovaries nonpalpable   Cervix parous and pink- No discharge  Musculoskeletal:        General: No tenderness. Normal range of motion.     Cervical back: Normal  range of motion and neck supple.  Skin:    General: Skin is warm and dry.          Comments: Small erythemas on left flank  Toenails thick and discolored   Neurological:     Mental Status: She is alert and oriented to person, place, and time.     Cranial Nerves: No cranial nerve deficit.     Deep Tendon Reflexes: Reflexes are normal and symmetric.  Psychiatric:        Behavior: Behavior normal.        Thought Content: Thought content normal.        Judgment: Judgment normal.    BP 131/85   Pulse 69   Temp 98 F (36.7 C) (Temporal)   Ht 5\' 5"  (1.651 m)   Wt 139 lb (63 kg)   LMP 03/23/2022   SpO2 98%   BMI 23.13 kg/m      Assessment & Plan:   Rebecca Dennis comes in today with chief complaint of Annual Exam and Tick Removal (She  was bit yesterday)   Diagnosis and orders addressed:  1. Annual physical exam - Tdap vaccine greater than or equal to 7yo IM - CMP14+EGFR - CBC with Differential/Platelet - Lipid panel - TSH  2. Need for Tdap vaccination - Tdap vaccine greater than or equal to 7yo IM  3. Tick bite of abdominal wall, initial encounter -Pt to report any new fever, joint pain, or rash -Wear protective clothing while outside- Long sleeves and long pants -Put insect repellent on all exposed skin and along clothing -Take a shower as soon as possible after being outside - doxycycline (VIBRA-TABS) 100 MG tablet; Take 2 tablets (200 mg total) by mouth once for 1 dose.  Dispense: 2 tablet; Refill: 0  4. Onychomycosis Start terbinafine - terbinafine (LAMISIL) 250 MG tablet; Take 1 tablet (250 mg total) by mouth daily.  Dispense: 90 tablet; Refill: 0  5. Skin change - Ambulatory referral to Dermatology  6. Vitamin D deficiency - VITAMIN D 25 Hydroxy (Vit-D Deficiency, Fractures)  7. Gynecologic exam normal - Cytology - PAP([place])   Labs pending Health Maintenance reviewed Diet and exercise encouraged  Follow up plan: 1 year    Evelina Dun, FNP

## 2022-03-30 NOTE — Patient Instructions (Signed)

## 2022-03-31 LAB — CMP14+EGFR
ALT: 10 IU/L (ref 0–32)
AST: 19 IU/L (ref 0–40)
Albumin/Globulin Ratio: 1.7 (ref 1.2–2.2)
Albumin: 4.3 g/dL (ref 3.9–4.9)
Alkaline Phosphatase: 54 IU/L (ref 44–121)
BUN/Creatinine Ratio: 12 (ref 9–23)
BUN: 11 mg/dL (ref 6–24)
Bilirubin Total: 0.4 mg/dL (ref 0.0–1.2)
CO2: 22 mmol/L (ref 20–29)
Calcium: 9.2 mg/dL (ref 8.7–10.2)
Chloride: 102 mmol/L (ref 96–106)
Creatinine, Ser: 0.91 mg/dL (ref 0.57–1.00)
Globulin, Total: 2.5 g/dL (ref 1.5–4.5)
Glucose: 82 mg/dL (ref 70–99)
Potassium: 3.9 mmol/L (ref 3.5–5.2)
Sodium: 139 mmol/L (ref 134–144)
Total Protein: 6.8 g/dL (ref 6.0–8.5)
eGFR: 79 mL/min/{1.73_m2} (ref >59)

## 2022-03-31 LAB — LIPID PANEL
Chol/HDL Ratio: 1.8 ratio (ref 0.0–4.4)
Cholesterol, Total: 156 mg/dL (ref 100–199)
HDL: 85 mg/dL (ref >39)
LDL Chol Calc (NIH): 62 mg/dL (ref 0–99)
Triglycerides: 39 mg/dL (ref 0–149)
VLDL Cholesterol Cal: 9 mg/dL (ref 5–40)

## 2022-03-31 LAB — CBC WITH DIFFERENTIAL/PLATELET
Basophils Absolute: 0.1 10*3/uL (ref 0.0–0.2)
Basos: 2 %
EOS (ABSOLUTE): 0 10*3/uL (ref 0.0–0.4)
Eos: 1 %
Hematocrit: 37.3 % (ref 34.0–46.6)
Hemoglobin: 12.7 g/dL (ref 11.1–15.9)
Immature Grans (Abs): 0 10*3/uL (ref 0.0–0.1)
Immature Granulocytes: 0 %
Lymphocytes Absolute: 1 10*3/uL (ref 0.7–3.1)
Lymphs: 32 %
MCH: 31.2 pg (ref 26.6–33.0)
MCHC: 34 g/dL (ref 31.5–35.7)
MCV: 92 fL (ref 79–97)
Monocytes Absolute: 0.3 10*3/uL (ref 0.1–0.9)
Monocytes: 11 %
Neutrophils Absolute: 1.7 10*3/uL (ref 1.4–7.0)
Neutrophils: 54 %
Platelets: 336 10*3/uL (ref 150–450)
RBC: 4.07 x10E6/uL (ref 3.77–5.28)
RDW: 12 % (ref 11.7–15.4)
WBC: 3.1 10*3/uL — ABNORMAL LOW (ref 3.4–10.8)

## 2022-03-31 LAB — VITAMIN D 25 HYDROXY (VIT D DEFICIENCY, FRACTURES): Vit D, 25-Hydroxy: 50.3 ng/mL (ref 30.0–100.0)

## 2022-03-31 LAB — TSH: TSH: 5.03 u[IU]/mL — ABNORMAL HIGH (ref 0.450–4.500)

## 2022-04-02 LAB — CYTOLOGY - PAP
Comment: NEGATIVE
Diagnosis: UNDETERMINED — AB
High risk HPV: NEGATIVE

## 2022-06-08 ENCOUNTER — Encounter: Payer: Self-pay | Admitting: Family

## 2022-06-08 ENCOUNTER — Ambulatory Visit (INDEPENDENT_AMBULATORY_CARE_PROVIDER_SITE_OTHER): Payer: 59 | Admitting: Family

## 2022-06-08 VITALS — BP 105/73 | HR 79 | Temp 98.1°F | Resp 20 | Ht 65.0 in | Wt 141.0 lb

## 2022-06-08 DIAGNOSIS — R7989 Other specified abnormal findings of blood chemistry: Secondary | ICD-10-CM

## 2022-06-08 NOTE — Patient Instructions (Signed)

## 2022-06-08 NOTE — Progress Notes (Signed)
   Subjective:    Patient ID: Rebecca Dennis, female    DOB: Feb 11, 1975, 47 y.o.   MRN: [id number]  Chief Complaint  Patient presents with   2 month recheck thyroid   PT presents to the office today to recheck TSH. She was seen on 03/30/22 and had a slight abnormal TSH of 5.03.  Thyroid Problem Presents for follow-up visit. Symptoms include dry skin and fatigue. Patient reports no anxiety, constipation, hoarse voice or tremors. The symptoms have been stable.      Review of Systems  Constitutional:  Positive for fatigue.  HENT:  Negative for hoarse voice.   Gastrointestinal:  Negative for constipation.  Neurological:  Negative for tremors.  Psychiatric/Behavioral:  The patient is not nervous/anxious.   All other systems reviewed and are negative.      Objective:   Physical Exam Vitals reviewed.  Constitutional:      General: She is not in acute distress.    Appearance: She is well-developed.  HENT:     Head: Normocephalic and atraumatic.     Right Ear: Tympanic membrane normal.     Left Ear: Tympanic membrane normal.  Eyes:     Pupils: Pupils are equal, round, and reactive to light.  Neck:     Thyroid: No thyromegaly.  Cardiovascular:     Rate and Rhythm: Normal rate and regular rhythm.     Heart sounds: Normal heart sounds. No murmur heard. Pulmonary:     Effort: Pulmonary effort is normal. No respiratory distress.     Breath sounds: Normal breath sounds. No wheezing.  Abdominal:     General: Bowel sounds are normal. There is no distension.     Palpations: Abdomen is soft.     Tenderness: There is no abdominal tenderness.  Musculoskeletal:        General: No tenderness. Normal range of motion.     Cervical back: Normal range of motion and neck supple.  Skin:    General: Skin is warm and dry.  Neurological:     Mental Status: She is alert and oriented to person, place, and time.     Cranial Nerves: No cranial nerve deficit.     Deep Tendon Reflexes: Reflexes are  normal and symmetric.  Psychiatric:        Behavior: Behavior normal.        Thought Content: Thought content normal.        Judgment: Judgment normal.       BP 105/73   Pulse 79   Temp 98.1 F (36.7 C) (Temporal)   Resp 20   Ht 5\' 5"  (1.651 m)   Wt 141 lb (64 kg)   SpO2 95%   BMI 23.46 kg/m      Assessment & Plan:  Rebecca Dennis comes in today with chief complaint of 2 month recheck thyroid   Diagnosis and orders addressed:  1. Abnormal TSH Labs pending  If still abnormal will start medication.  - TSH   Labs pending Health Maintenance reviewed Diet and exercise encouraged  Follow up plan: 1 year    Jannifer Rodney, FNP

## 2022-06-09 LAB — TSH: TSH: 4.7 u[IU]/mL — ABNORMAL HIGH (ref 0.450–4.500)

## 2022-06-11 ENCOUNTER — Other Ambulatory Visit: Payer: Self-pay | Admitting: Family

## 2022-06-11 DIAGNOSIS — E039 Hypothyroidism, unspecified: Secondary | ICD-10-CM

## 2022-06-11 MED ORDER — LEVOTHYROXINE SODIUM 25 MCG PO TABS: 25.0000 ug | ORAL_TABLET | Freq: Every day | ORAL | 1 refills | Status: DC

## 2022-07-20 ENCOUNTER — Other Ambulatory Visit: Payer: Self-pay | Admitting: *Deleted

## 2022-07-20 MED ORDER — LEVOTHYROXINE SODIUM 25 MCG PO TABS: 25.0000 ug | ORAL_TABLET | Freq: Every day | ORAL | 0 refills | Status: DC

## 2022-08-10 ENCOUNTER — Ambulatory Visit (INDEPENDENT_AMBULATORY_CARE_PROVIDER_SITE_OTHER): Payer: 59 | Admitting: Family

## 2022-08-10 ENCOUNTER — Encounter: Payer: Self-pay | Admitting: Family

## 2022-08-10 VITALS — BP 110/76 | HR 70 | Temp 98.1°F | Ht 65.0 in | Wt 141.8 lb

## 2022-08-10 DIAGNOSIS — E039 Hypothyroidism, unspecified: Secondary | ICD-10-CM | POA: Diagnosis not present

## 2022-08-10 NOTE — Progress Notes (Signed)
   Subjective:    Patient ID: Rebecca Dennis, female    DOB: February 09, 1975, 47 y.o.   MRN: [id number]  Chief Complaint  Patient presents with   Hypothyroidism   Pre-visit Screening Tool Documentation    2 mth rck    Pt presents to the office today to follow up on hypothyroidism. She was seen on  03/30/22 with TSH of 5.03 and 06/08/22 and TSH of 4.7. She was having increase fatigued and started her on levothyroxine 25 mcg. She is still complaining of fatigue.  Thyroid Problem Presents for follow-up visit. Symptoms include fatigue. Patient reports no anxiety, constipation or diarrhea. The symptoms have been stable.      Review of Systems  Constitutional:  Positive for fatigue.  Gastrointestinal:  Negative for constipation and diarrhea.  Psychiatric/Behavioral:  The patient is not nervous/anxious.   All other systems reviewed and are negative.      Objective:   Physical Exam Vitals reviewed.  Constitutional:      General: She is not in acute distress.    Appearance: She is well-developed.  HENT:     Head: Normocephalic and atraumatic.     Right Ear: Tympanic membrane normal.     Left Ear: Tympanic membrane normal.  Eyes:     Pupils: Pupils are equal, round, and reactive to light.  Neck:     Thyroid: No thyromegaly.  Cardiovascular:     Rate and Rhythm: Normal rate and regular rhythm.     Heart sounds: Normal heart sounds. No murmur heard. Pulmonary:     Effort: Pulmonary effort is normal. No respiratory distress.     Breath sounds: Normal breath sounds. No wheezing.  Abdominal:     General: Bowel sounds are normal. There is no distension.     Palpations: Abdomen is soft.     Tenderness: There is no abdominal tenderness.  Musculoskeletal:        General: No tenderness. Normal range of motion.     Cervical back: Normal range of motion and neck supple.  Skin:    General: Skin is warm and dry.  Neurological:     Mental Status: She is alert and oriented to person, place, and  time.     Cranial Nerves: No cranial nerve deficit.     Deep Tendon Reflexes: Reflexes are normal and symmetric.  Psychiatric:        Behavior: Behavior normal.        Thought Content: Thought content normal.        Judgment: Judgment normal.       BP 110/76   Pulse 70   Temp 98.1 F (36.7 C) (Temporal)   Ht 5\' 5"  (1.651 m)   Wt 141 lb 12.8 oz (64.3 kg)   SpO2 96%   BMI 23.60 kg/m      Assessment & Plan:  Rebecca Dennis comes in today with chief complaint of Hypothyroidism   Diagnosis and orders addressed:  1. Hypothyroidism, unspecified type Labs pending  Continue levothyroxine 25 mcg Healthy diet and exercise  - TSH If labs  normal follow up in 1 year       Rebecca Rodney, FNP

## 2022-08-10 NOTE — Patient Instructions (Signed)

## 2022-08-12 IMAGING — MG MM DIGITAL SCREENING BILAT W/ TOMO AND CAD
8 series · 9 of 24 positions shown · non-contrast
Comparison: Previous exam(s).

CLINICAL DATA: Screening.

EXAM:
DIGITAL SCREENING BILATERAL MAMMOGRAM WITH TOMOSYNTHESIS AND CAD
TECHNIQUE: Bilateral screening digital craniocaudal and mediolateral oblique
mammograms were obtained. Bilateral screening digital breast
tomosynthesis was performed. The images were evaluated with
computer-aided detection.

[R MLO synth-2D]
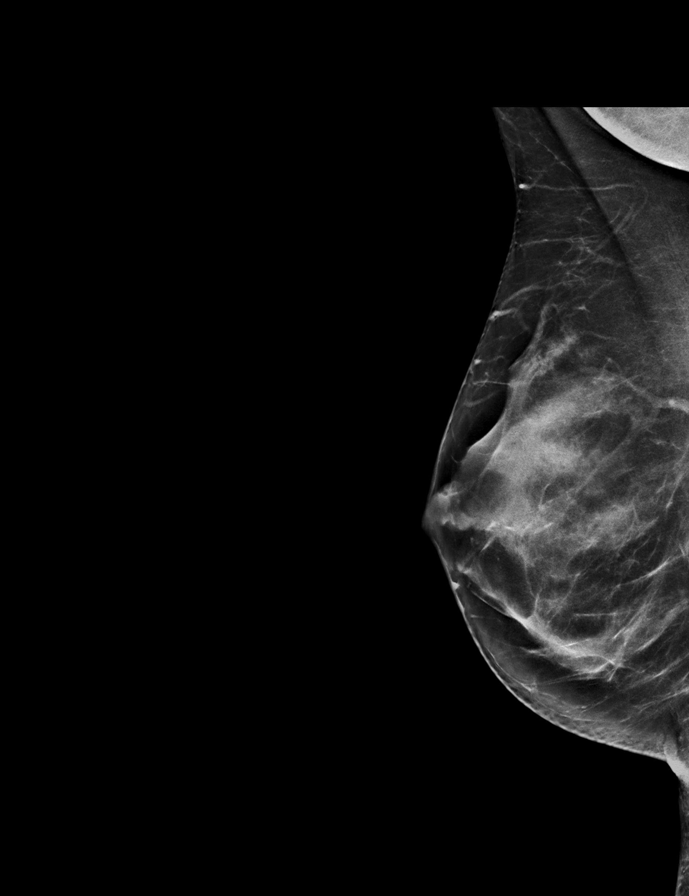

[R CC synth-2D]
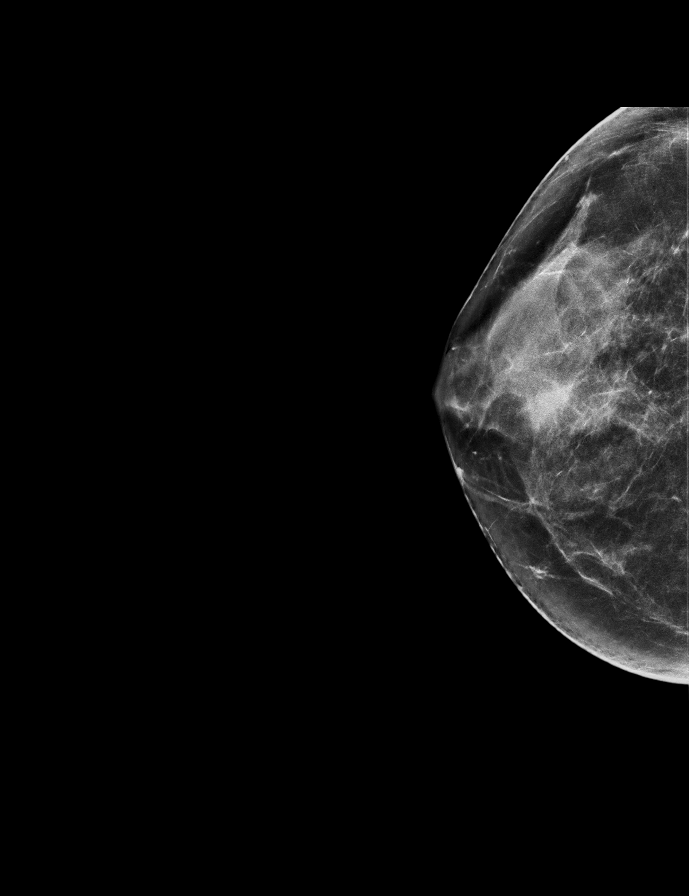

[L MLO synth-2D]
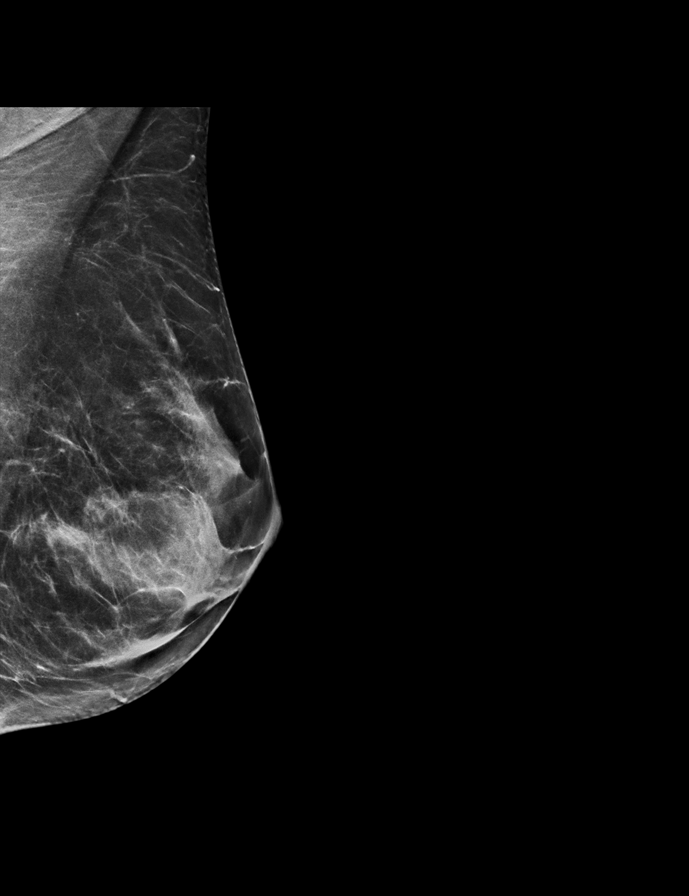

[L CC synth-2D]
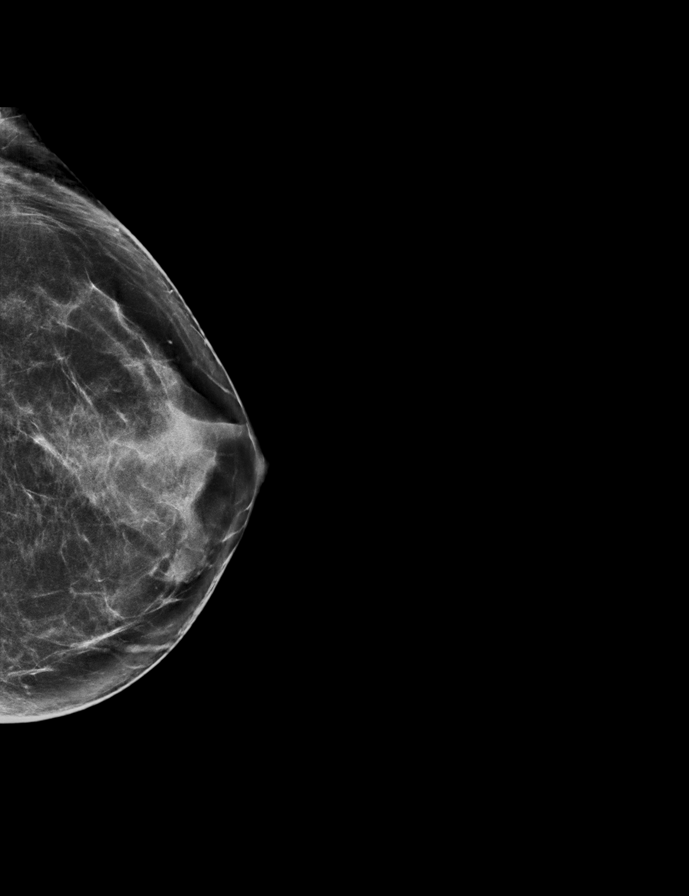

[R MLO tomo · 2 of 67 frames shown]
[frame 22/67]
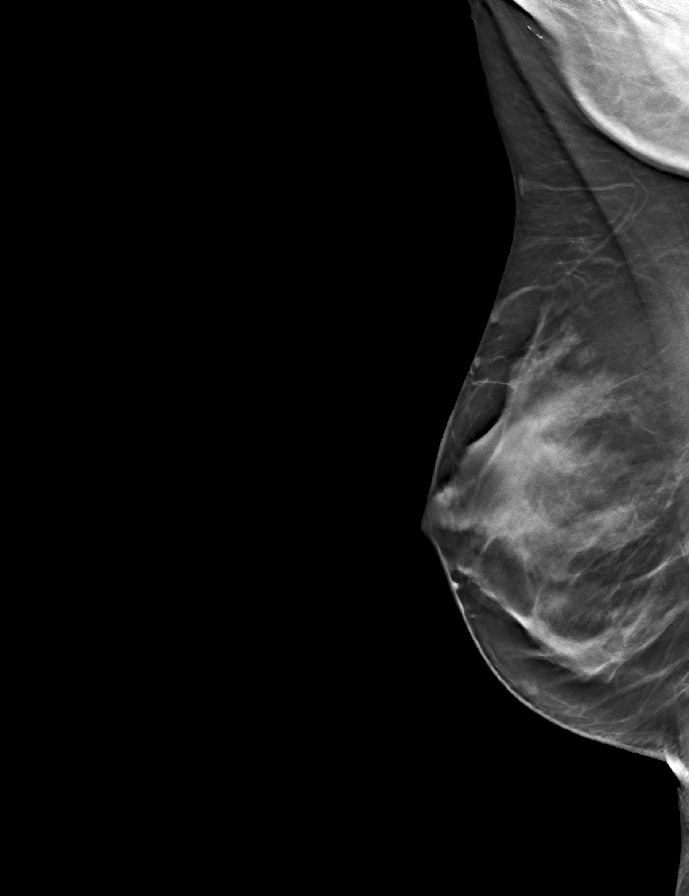
[frame 34/67]
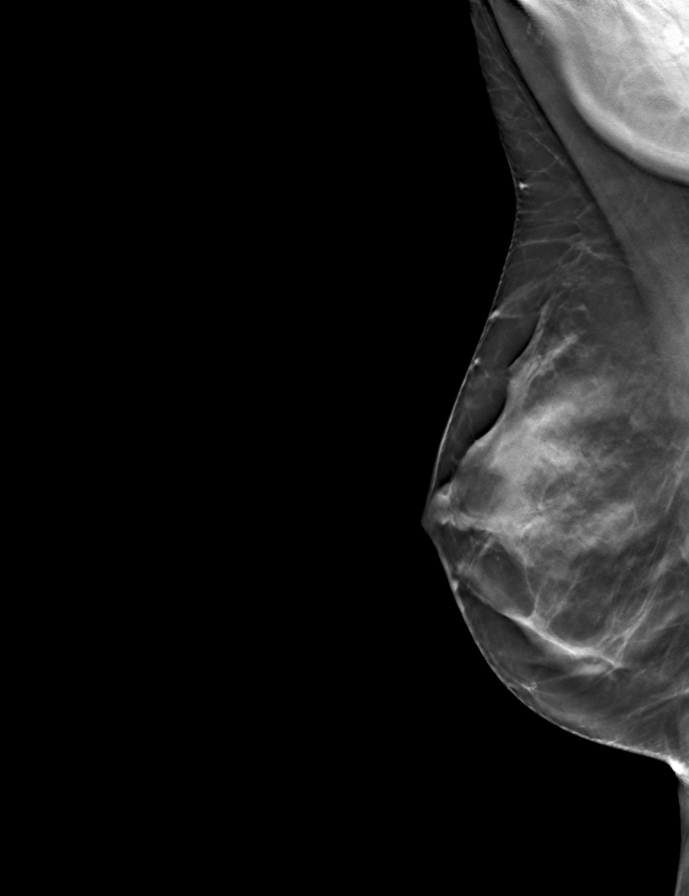

[L MLO tomo · tomo slice 31/60.0]
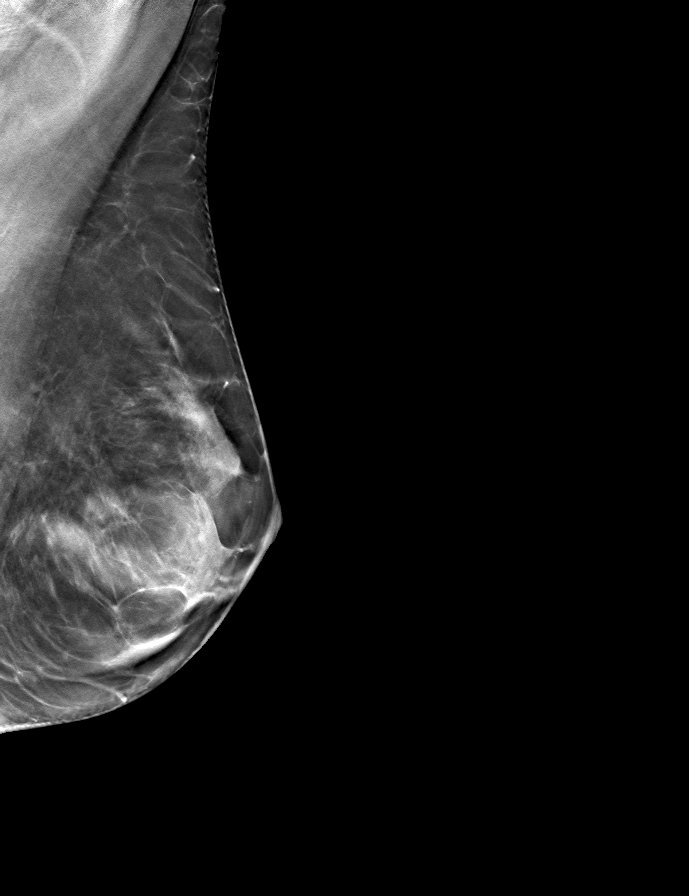

[R CC tomo · tomo slice 31/61.0]
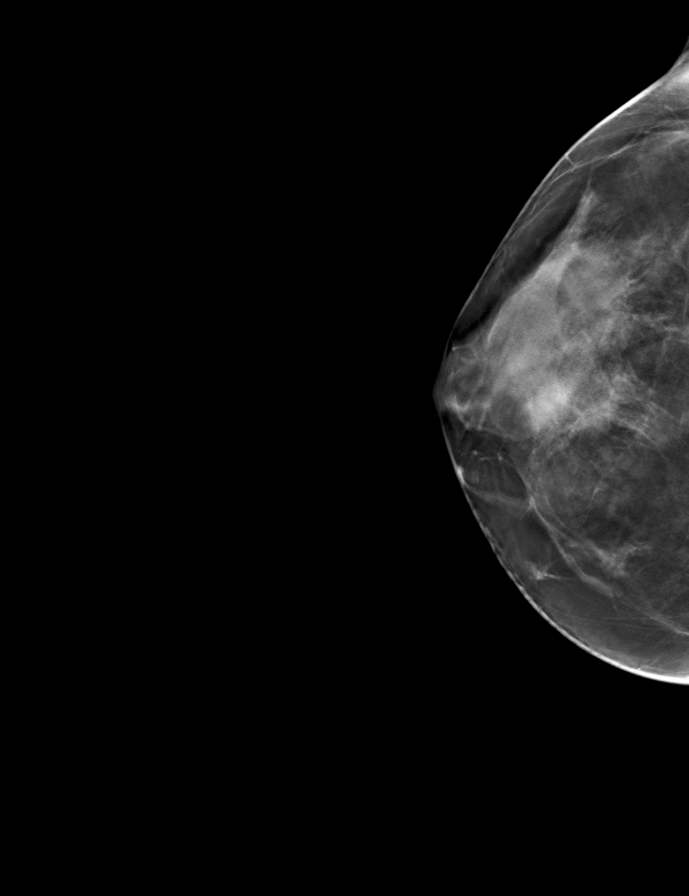

[L CC tomo · tomo slice 33/65.0]
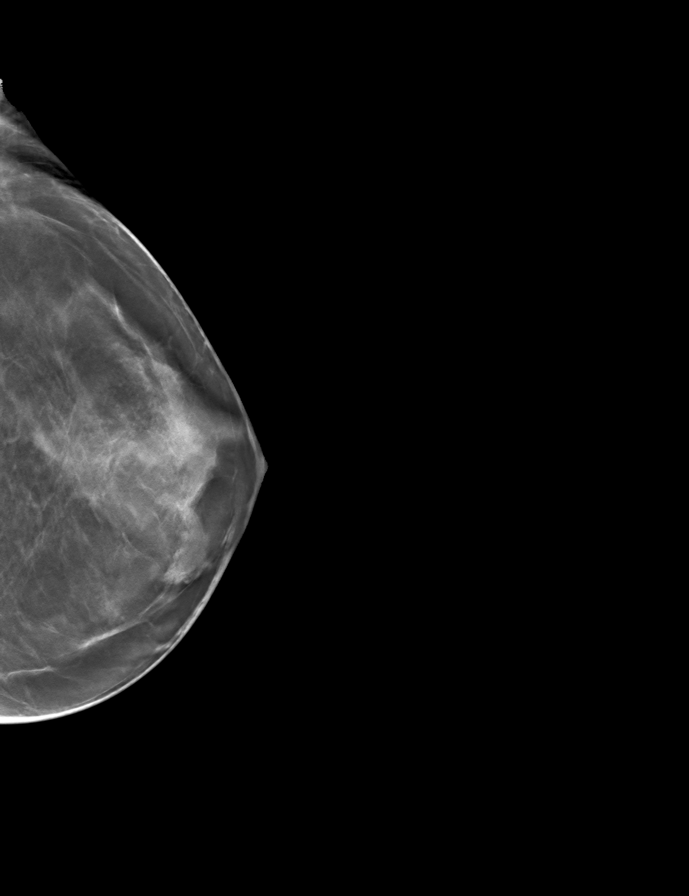

[9 of 24 positions shown; findings below may reference images not displayed]

ACR Breast Density Category c: The breast tissue is heterogeneously
dense, which may obscure small masses.
FINDINGS: There are no findings suspicious for malignancy. The images were
evaluated with computer-aided detection.
IMPRESSION: No mammographic evidence of malignancy. A result letter of this
screening mammogram will be mailed directly to the patient.

RECOMMENDATION:
Screening mammogram in one year. (Code:T4-5-GWO)

BI-RADS CATEGORY  1: Negative.

## 2022-11-30 ENCOUNTER — Other Ambulatory Visit: Payer: Self-pay | Admitting: Family

## 2023-03-25 ENCOUNTER — Other Ambulatory Visit: Payer: Self-pay | Admitting: Family

## 2023-03-25 DIAGNOSIS — Z1231 Encounter for screening mammogram for malignant neoplasm of breast: Secondary | ICD-10-CM

## 2023-04-05 ENCOUNTER — Ambulatory Visit (INDEPENDENT_AMBULATORY_CARE_PROVIDER_SITE_OTHER): Payer: 59 | Admitting: Family

## 2023-04-05 ENCOUNTER — Other Ambulatory Visit (HOSPITAL_COMMUNITY)
Admission: RE | Admit: 2023-04-05 | Discharge: 2023-04-05 | Disposition: A | Discharge status: Home / Self Care | Source: Ambulatory Visit | Attending: Family | Admitting: Family

## 2023-04-05 ENCOUNTER — Encounter: Payer: Self-pay | Admitting: Family

## 2023-04-05 VITALS — BP 114/77 | HR 82 | Temp 98.0°F | Ht 65.0 in | Wt 140.0 lb

## 2023-04-05 DIAGNOSIS — E559 Vitamin D deficiency, unspecified: Secondary | ICD-10-CM | POA: Diagnosis not present

## 2023-04-05 DIAGNOSIS — Z0000 Encounter for general adult medical examination without abnormal findings: Secondary | ICD-10-CM | POA: Diagnosis present

## 2023-04-05 DIAGNOSIS — Z01419 Encounter for gynecological examination (general) (routine) without abnormal findings: Secondary | ICD-10-CM

## 2023-04-05 DIAGNOSIS — Z1211 Encounter for screening for malignant neoplasm of colon: Secondary | ICD-10-CM

## 2023-04-05 DIAGNOSIS — E039 Hypothyroidism, unspecified: Secondary | ICD-10-CM

## 2023-04-05 MED ORDER — LEVOTHYROXINE SODIUM 25 MCG PO TABS: 25.0000 ug | ORAL_TABLET | Freq: Every day | ORAL | 2 refills | Status: DC

## 2023-04-05 NOTE — Patient Instructions (Signed)

## 2023-04-05 NOTE — Progress Notes (Signed)
 Subjective:    Patient ID: Rebecca Dennis, female    DOB: 01/04/1976, 48 y.o.   MRN: [id number]  Chief Complaint  Patient presents with   Annual Exam   Pt presents to the office today for CPE with pap.  Thyroid Problem Presents for follow-up visit. Symptoms include fatigue (some times). Patient reports no anxiety, constipation, diarrhea or dry skin. The symptoms have been stable.  Gynecologic Exam The patient's pertinent negatives include no genital itching, genital odor, vaginal bleeding or vaginal discharge. The current episode started more than 1 year ago. The problem occurs intermittently. Pertinent negatives include no constipation or diarrhea.      Review of Systems  Constitutional:  Positive for fatigue (some times).  Gastrointestinal:  Negative for constipation and diarrhea.  Genitourinary:  Negative for vaginal discharge.  Psychiatric/Behavioral:  The patient is not nervous/anxious.   All other systems reviewed and are negative.  Social History   Socioeconomic History   Marital status: Married    Spouse name: Not on file   Number of children: Not on file   Years of education: Not on file   Highest education level: Not on file  Occupational History   Not on file  Tobacco Use   Smoking status: Never   Smokeless tobacco: Never  Vaping Use   Vaping status: Never Used  Substance and Sexual Activity   Alcohol use: No    Alcohol/week: 0.0 standard drinks of alcohol   Drug use: No   Sexual activity: Yes    Birth control/protection: Coitus interruptus  Other Topics Concern   Not on file  Social History Narrative   Not on file   Social Drivers of Health   Financial Resource Strain: Not on file  Food Insecurity: Not on file  Transportation Needs: Not on file  Physical Activity: Not on file  Stress: Not on file  Social Connections: Not on file   History reviewed. No pertinent family history. History reviewed. No pertinent surgical history.     Objective:    Physical Exam Vitals reviewed.  Constitutional:      General: She is not in acute distress.    Appearance: She is well-developed.  HENT:     Head: Normocephalic and atraumatic.     Right Ear: Tympanic membrane normal.     Left Ear: Tympanic membrane normal.  Eyes:     Pupils: Pupils are equal, round, and reactive to light.  Neck:     Thyroid: No thyromegaly.  Cardiovascular:     Rate and Rhythm: Normal rate and regular rhythm.     Heart sounds: Normal heart sounds. No murmur heard. Pulmonary:     Effort: Pulmonary effort is normal. No respiratory distress.     Breath sounds: Normal breath sounds. No wheezing.  Abdominal:     General: Bowel sounds are normal. There is no distension.     Palpations: Abdomen is soft.     Tenderness: There is no abdominal tenderness.  Genitourinary:    Comments: Bimanual exam- no adnexal masses or tenderness, ovaries nonpalpable   Cervix parous and pink- No discharge  Musculoskeletal:        General: No tenderness. Normal range of motion.     Cervical back: Normal range of motion and neck supple.  Skin:    General: Skin is warm and dry.  Neurological:     Mental Status: She is alert and oriented to person, place, and time.     Cranial Nerves: No cranial nerve deficit.  Deep Tendon Reflexes: Reflexes are normal and symmetric.  Psychiatric:        Behavior: Behavior normal.        Thought Content: Thought content normal.        Judgment: Judgment normal.       BP 114/77   Pulse 82   Temp 98 F (36.7 C)   Ht 5\' 5"  (1.651 m)   Wt 140 lb (63.5 kg)   SpO2 97%   BMI 23.30 kg/m      Assessment & Plan:  Rebecca Dennis comes in today with chief complaint of Annual Exam   Diagnosis and orders addressed:  1. Annual physical exam (Primary) - CMP14+EGFR - CBC with Differential/Platelet - Lipid panel - TSH - VITAMIN D 25 Hydroxy (Vit-D Deficiency, Fractures) - Cytology - PAP([facility])  2. Gynecologic exam normal - CMP14+EGFR -  Cytology - PAP()  3. Acquired hypothyroidism - CMP14+EGFR - TSH - levothyroxine (SYNTHROID) 25 MCG tablet; Take 1 tablet (25 mcg total) by mouth daily before breakfast.  Dispense: 90 tablet; Refill: 2  4. Vitamin D deficiency  - CMP14+EGFR - VITAMIN D 25 Hydroxy (Vit-D Deficiency, Fractures)  5. Colon cancer screening - Cologuard - CMP14+EGFR   Continue current medications  Health maintenance completed Healthy diet and exercise  Follow up in 1 year or as needed.   Jannifer Rodney, FNP

## 2023-04-06 LAB — CMP14+EGFR
ALT: 9 IU/L (ref 0–32)
AST: 18 IU/L (ref 0–40)
Albumin: 4.1 g/dL (ref 3.9–4.9)
Alkaline Phosphatase: 48 IU/L (ref 44–121)
BUN/Creatinine Ratio: 21 (ref 9–23)
BUN: 16 mg/dL (ref 6–24)
Bilirubin Total: 0.5 mg/dL (ref 0.0–1.2)
CO2: 20 mmol/L (ref 20–29)
Calcium: 9.4 mg/dL (ref 8.7–10.2)
Chloride: 107 mmol/L — ABNORMAL HIGH (ref 96–106)
Creatinine, Ser: 0.77 mg/dL (ref 0.57–1.00)
Globulin, Total: 2.6 g/dL (ref 1.5–4.5)
Glucose: 92 mg/dL (ref 70–99)
Potassium: 4.4 mmol/L (ref 3.5–5.2)
Sodium: 142 mmol/L (ref 134–144)
Total Protein: 6.7 g/dL (ref 6.0–8.5)
eGFR: 96 mL/min/{1.73_m2} (ref >59)

## 2023-04-06 LAB — CBC WITH DIFFERENTIAL/PLATELET
Basophils Absolute: 0 10*3/uL (ref 0.0–0.2)
Basos: 1 %
EOS (ABSOLUTE): 0.1 10*3/uL (ref 0.0–0.4)
Eos: 2 %
Hematocrit: 39 % (ref 34.0–46.6)
Hemoglobin: 13 g/dL (ref 11.1–15.9)
Immature Grans (Abs): 0 10*3/uL (ref 0.0–0.1)
Immature Granulocytes: 0 %
Lymphocytes Absolute: 1.7 10*3/uL (ref 0.7–3.1)
Lymphs: 41 %
MCH: 31 pg (ref 26.6–33.0)
MCHC: 33.3 g/dL (ref 31.5–35.7)
MCV: 93 fL (ref 79–97)
Monocytes Absolute: 0.4 10*3/uL (ref 0.1–0.9)
Monocytes: 11 %
Neutrophils Absolute: 1.9 10*3/uL (ref 1.4–7.0)
Neutrophils: 45 %
Platelets: 334 10*3/uL (ref 150–450)
RBC: 4.2 x10E6/uL (ref 3.77–5.28)
RDW: 12.3 % (ref 11.7–15.4)
WBC: 4.1 10*3/uL (ref 3.4–10.8)

## 2023-04-06 LAB — VITAMIN D 25 HYDROXY (VIT D DEFICIENCY, FRACTURES): Vit D, 25-Hydroxy: 44 ng/mL (ref 30.0–100.0)

## 2023-04-06 LAB — TSH: TSH: 1.73 u[IU]/mL (ref 0.450–4.500)

## 2023-04-06 LAB — LIPID PANEL
Chol/HDL Ratio: 2.1 ratio (ref 0.0–4.4)
Cholesterol, Total: 140 mg/dL (ref 100–199)
HDL: 68 mg/dL (ref >39)
LDL Chol Calc (NIH): 63 mg/dL (ref 0–99)
Triglycerides: 35 mg/dL (ref 0–149)
VLDL Cholesterol Cal: 9 mg/dL (ref 5–40)

## 2023-04-08 LAB — CYTOLOGY - PAP: Diagnosis: NEGATIVE

## 2023-04-13 LAB — COLOGUARD: COLOGUARD: NEGATIVE

## 2023-05-05 ENCOUNTER — Ambulatory Visit
Admission: RE | Admit: 2023-05-05 | Discharge: 2023-05-05 | Disposition: A | Discharge status: Home / Self Care | Source: Ambulatory Visit | Attending: Family | Admitting: Family

## 2023-05-05 DIAGNOSIS — Z1231 Encounter for screening mammogram for malignant neoplasm of breast: Secondary | ICD-10-CM

## 2024-01-03 ENCOUNTER — Other Ambulatory Visit: Payer: Self-pay | Admitting: Family

## 2024-01-03 DIAGNOSIS — E039 Hypothyroidism, unspecified: Secondary | ICD-10-CM
